# Patient Record
Sex: Female | Born: 1955 | Race: Black or African American | Hispanic: No | Smoking: Never smoker
Health system: Southern US, Community
[De-identification: ages and names within clinical notes are randomized; demographics above are authoritative.]

## PROBLEM LIST (undated history)

## (undated) DIAGNOSIS — I10 Essential (primary) hypertension: Secondary | ICD-10-CM

## (undated) DIAGNOSIS — G4730 Sleep apnea, unspecified: Secondary | ICD-10-CM

## (undated) DIAGNOSIS — I491 Atrial premature depolarization: Secondary | ICD-10-CM

## (undated) HISTORY — PX: CARDIAC CATHETERIZATION: SHX172

## (undated) HISTORY — PX: EYE SURGERY: SHX253

## (undated) HISTORY — PX: COLONOSCOPY: SHX174

---

## 1993-08-08 HISTORY — PX: OOPHORECTOMY: SHX86

## 2006-10-20 ENCOUNTER — Emergency Department: Payer: Self-pay | Admitting: Emergency Medicine

## 2007-03-02 ENCOUNTER — Emergency Department: Payer: Self-pay | Admitting: Emergency Medicine

## 2007-11-07 ENCOUNTER — Emergency Department: Payer: Self-pay | Admitting: Emergency Medicine

## 2007-11-07 ENCOUNTER — Other Ambulatory Visit: Payer: Self-pay

## 2008-01-26 ENCOUNTER — Emergency Department: Payer: Self-pay | Admitting: Emergency Medicine

## 2008-02-29 ENCOUNTER — Other Ambulatory Visit: Payer: Self-pay

## 2008-02-29 ENCOUNTER — Emergency Department: Payer: Self-pay | Admitting: Emergency Medicine

## 2008-04-07 DIAGNOSIS — I10 Essential (primary) hypertension: Secondary | ICD-10-CM | POA: Insufficient documentation

## 2008-04-07 DIAGNOSIS — I491 Atrial premature depolarization: Secondary | ICD-10-CM | POA: Insufficient documentation

## 2008-05-15 ENCOUNTER — Ambulatory Visit: Payer: Self-pay | Admitting: Family Medicine

## 2008-06-24 ENCOUNTER — Emergency Department: Payer: Self-pay | Admitting: Emergency Medicine

## 2008-09-02 ENCOUNTER — Ambulatory Visit: Payer: Self-pay | Admitting: Family Medicine

## 2009-01-15 ENCOUNTER — Emergency Department: Payer: Self-pay | Admitting: Emergency Medicine

## 2009-11-04 ENCOUNTER — Emergency Department: Payer: Self-pay | Admitting: Emergency Medicine

## 2010-02-24 ENCOUNTER — Emergency Department: Payer: Self-pay | Admitting: Emergency Medicine

## 2010-05-27 ENCOUNTER — Emergency Department: Payer: Self-pay | Admitting: Emergency Medicine

## 2010-08-08 DIAGNOSIS — I341 Nonrheumatic mitral (valve) prolapse: Secondary | ICD-10-CM

## 2010-08-08 HISTORY — DX: Nonrheumatic mitral (valve) prolapse: I34.1

## 2012-08-07 ENCOUNTER — Emergency Department: Payer: Self-pay | Admitting: Emergency Medicine

## 2012-08-07 LAB — COMPREHENSIVE METABOLIC PANEL
Albumin: 3.9 g/dL (ref 3.4–5.0)
Alkaline Phosphatase: 105 U/L (ref 50–136)
BUN: 7 mg/dL (ref 7–18)
Bilirubin,Total: <0.1 mg/dL — ABNORMAL LOW (ref 0.2–1.0)
Calcium, Total: 8.9 mg/dL (ref 8.5–10.1)
Creatinine: 0.74 mg/dL (ref 0.60–1.30)
Glucose: 90 mg/dL (ref 65–99)
Osmolality: 284 (ref 275–301)
Potassium: 3.7 mmol/L (ref 3.5–5.1)
SGOT(AST): 28 U/L (ref 15–37)
SGPT (ALT): 21 U/L (ref 12–78)
Sodium: 144 mmol/L (ref 136–145)

## 2012-08-07 LAB — CBC
MCH: 32.5 pg (ref 26.0–34.0)
MCHC: 34.4 g/dL (ref 32.0–36.0)
MCV: 94 fL (ref 80–100)
Platelet: 286 10*3/uL (ref 150–440)
RDW: 13.2 % (ref 11.5–14.5)

## 2013-05-06 ENCOUNTER — Ambulatory Visit: Payer: Self-pay | Admitting: Physician Assistant

## 2013-08-08 HISTORY — PX: CATARACT EXTRACTION W/ INTRAOCULAR LENS IMPLANT: SHX1309

## 2013-10-27 ENCOUNTER — Ambulatory Visit: Payer: Self-pay

## 2013-10-28 ENCOUNTER — Ambulatory Visit: Payer: Self-pay

## 2013-12-10 ENCOUNTER — Ambulatory Visit: Payer: Self-pay | Admitting: Emergency Medicine

## 2014-01-04 ENCOUNTER — Ambulatory Visit: Payer: Self-pay | Admitting: Family Medicine

## 2014-01-13 ENCOUNTER — Emergency Department: Payer: Self-pay | Admitting: Emergency Medicine

## 2014-03-05 ENCOUNTER — Emergency Department: Payer: Self-pay | Admitting: Emergency Medicine

## 2014-03-05 LAB — URINALYSIS, COMPLETE
Bilirubin,UR: NEGATIVE
Blood: NEGATIVE
GLUCOSE, UR: NEGATIVE mg/dL (ref 0–75)
KETONE: NEGATIVE
NITRITE: NEGATIVE
Ph: 7 (ref 4.5–8.0)
Protein: NEGATIVE
SPECIFIC GRAVITY: 1.006 (ref 1.003–1.030)
Squamous Epithelial: 8
WBC UR: 45 /HPF (ref 0–5)

## 2014-04-02 ENCOUNTER — Ambulatory Visit: Payer: Self-pay | Admitting: Internal Medicine

## 2014-05-02 ENCOUNTER — Ambulatory Visit (INDEPENDENT_AMBULATORY_CARE_PROVIDER_SITE_OTHER): Payer: Medicaid Other

## 2014-05-02 ENCOUNTER — Encounter: Payer: Self-pay | Admitting: Podiatry

## 2014-05-02 ENCOUNTER — Ambulatory Visit (INDEPENDENT_AMBULATORY_CARE_PROVIDER_SITE_OTHER): Payer: Medicaid Other | Admitting: Podiatry

## 2014-05-02 VITALS — BP 145/84 | HR 78 | Resp 16 | Ht 63.0 in | Wt 169.0 lb

## 2014-05-02 DIAGNOSIS — M898X9 Other specified disorders of bone, unspecified site: Secondary | ICD-10-CM

## 2014-05-02 DIAGNOSIS — S93609A Unspecified sprain of unspecified foot, initial encounter: Secondary | ICD-10-CM | POA: Diagnosis not present

## 2014-05-02 DIAGNOSIS — M779 Enthesopathy, unspecified: Secondary | ICD-10-CM

## 2014-05-02 MED ORDER — TRIAMCINOLONE ACETONIDE 10 MG/ML IJ SUSP
10.0000 mg | Freq: Once | INTRAMUSCULAR | Status: AC
Start: 2014-05-02 — End: 2014-05-02
  Administered 2014-05-02: 10 mg
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
## 2014-06-27 ENCOUNTER — Encounter: Payer: Self-pay | Admitting: Podiatry

## 2014-06-27 ENCOUNTER — Ambulatory Visit (INDEPENDENT_AMBULATORY_CARE_PROVIDER_SITE_OTHER): Payer: Medicaid Other | Admitting: Podiatry

## 2014-06-27 VITALS — BP 135/80 | HR 70 | Resp 16

## 2014-06-27 DIAGNOSIS — M779 Enthesopathy, unspecified: Secondary | ICD-10-CM

## 2014-06-27 DIAGNOSIS — M7661 Achilles tendinitis, right leg: Secondary | ICD-10-CM | POA: Diagnosis not present

## 2014-06-27 MED ORDER — TRIAMCINOLONE ACETONIDE 10 MG/ML IJ SUSP
10.0000 mg | Freq: Once | INTRAMUSCULAR | Status: AC
Start: 2014-06-27 — End: 2014-06-27
  Administered 2014-06-27: 10 mg

## 2014-06-28 NOTE — Progress Notes (Signed)
Subjective:     Patient ID: Donna Colon, female   DOB: 09-20-1955, 58 y.o.   MRN: [id number]  HPI patient presents stating the ankle that he worked on is pretty good but I'm having pain in my forefoot and I wanted to get that checked   Review of Systems     Objective:   Physical Exam Neurovascular status unchanged with discomfort around the fourth metatarsal phalangeal joint with inflammation noted and no discomfort currently in the ankle region    Assessment:     Inflammatory condition with pain    Plan:     Careful periarticular injection 3 mg dexamethasone

## 2014-10-03 ENCOUNTER — Encounter (INDEPENDENT_AMBULATORY_CARE_PROVIDER_SITE_OTHER): Payer: Medicaid Other | Admitting: Ophthalmology

## 2014-10-03 DIAGNOSIS — H43813 Vitreous degeneration, bilateral: Secondary | ICD-10-CM

## 2014-10-03 DIAGNOSIS — H35033 Hypertensive retinopathy, bilateral: Secondary | ICD-10-CM

## 2014-10-03 DIAGNOSIS — I10 Essential (primary) hypertension: Secondary | ICD-10-CM

## 2014-10-03 DIAGNOSIS — H2512 Age-related nuclear cataract, left eye: Secondary | ICD-10-CM

## 2014-12-16 ENCOUNTER — Telehealth: Payer: Self-pay | Admitting: *Deleted

## 2014-12-16 NOTE — Telephone Encounter (Signed)
Jamie request up-dated PT script faxed to [phone].

## 2014-12-17 NOTE — Telephone Encounter (Signed)
PT facility wanted up to date PT form but I am unsure what the orders would need to be. Evaluate and treat?

## 2014-12-23 ENCOUNTER — Telehealth: Payer: Self-pay | Admitting: *Deleted

## 2014-12-23 NOTE — Telephone Encounter (Signed)
Ok what ever she wants

## 2014-12-23 NOTE — Telephone Encounter (Signed)
Jaime request rx for pt's PT.

## 2015-01-15 ENCOUNTER — Telehealth: Payer: Self-pay | Admitting: *Deleted

## 2015-01-15 NOTE — Telephone Encounter (Signed)
[facility] states she needed to discuss pt's progress.  I called Roselyn Reef she states pt continues to have persistent pain in and around the ankle, and pt consistently misses or cancels appts, including todays.  I told Roselyn Reef I would contact pt and have her come in to be reevaluated by Dr. Paulla Dolly.  Left message [phone] to call and schedule an appt with Dr. Paulla Dolly.

## 2015-01-19 ENCOUNTER — Telehealth: Payer: Self-pay | Admitting: *Deleted

## 2015-01-19 NOTE — Telephone Encounter (Signed)
Donna Colon states she spoke with pt, who stated she would be getting a new referral for PT and would be seeing a different doctor, because Dr. Paulla Dolly was no longer in the [place] office.  Dr. Paulla Dolly states D/C current PT orders.  Informed Jamie.

## 2015-02-05 ENCOUNTER — Ambulatory Visit (INDEPENDENT_AMBULATORY_CARE_PROVIDER_SITE_OTHER): Payer: Self-pay

## 2015-02-05 ENCOUNTER — Ambulatory Visit (INDEPENDENT_AMBULATORY_CARE_PROVIDER_SITE_OTHER): Payer: Self-pay | Admitting: Podiatry

## 2015-02-05 VITALS — BP 138/90 | HR 74 | Resp 16

## 2015-02-05 DIAGNOSIS — M25572 Pain in left ankle and joints of left foot: Secondary | ICD-10-CM

## 2015-02-05 DIAGNOSIS — M779 Enthesopathy, unspecified: Secondary | ICD-10-CM
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
## 2015-02-20 ENCOUNTER — Ambulatory Visit
Admission: EM | Admit: 2015-02-20 | Discharge: 2015-02-20 | Disposition: A | Discharge status: Home / Self Care | Payer: Medicaid Other | Attending: Internal Medicine | Admitting: Internal Medicine

## 2015-02-20 DIAGNOSIS — J019 Acute sinusitis, unspecified: Secondary | ICD-10-CM | POA: Diagnosis not present

## 2015-02-20 HISTORY — DX: Essential (primary) hypertension: I10

## 2015-02-20 MED ORDER — PREDNISONE 50 MG PO TABS: 50.0000 mg | ORAL_TABLET | Freq: Every day | ORAL | Status: DC

## 2015-02-20 MED ORDER — AMOXICILLIN-POT CLAVULANATE 875-125 MG PO TABS: 1.0000 | ORAL_TABLET | Freq: Two times a day (BID) | ORAL | Status: DC

## 2015-02-20 NOTE — ED Provider Notes (Signed)
CSN: [id number]     Arrival date & time 02/20/15  [id number] History   First MD Initiated Contact with Patient 02/20/15 1007     Chief Complaint  Patient presents with  . Sinusitis   HPI  59yo lady with 2 wks hx sinus congestion, post nasal drainage, now prod cough.  Sneezing.  Head/ear pressure.  Low grade temps (99.5).  Malaise.  Nausea, emesis x 1.  Scant mucusy drainage in eyes.    Past Medical History  Diagnosis Date  . Hypertension    History reviewed. No pertinent past surgical history. Family History  Problem Relation Age of Onset  . Heart failure Mother   . Cancer Father    History  Substance Use Topics  . Smoking status: Never Smoker   . Smokeless tobacco: Not on file  . Alcohol Use: No    Review of Systems  All other systems reviewed and are negative.   Allergies  Codeine  Home Medications   Prior to Admission medications   Medication Sig Start Date End Date Taking? Authorizing Provider  aspirin EC 81 MG tablet Take 81 mg by mouth daily.   Yes Historical Provider, MD  amoxicillin-clavulanate (AUGMENTIN) 875-125 MG per tablet Take 1 tablet by mouth every 12 (twelve) hours. 02/20/15   Sherlene Shams, MD  predniSONE (DELTASONE) 50 MG tablet Take 1 tablet (50 mg total) by mouth daily. 02/20/15   Sherlene Shams, MD   BP 146/93 mmHg  Pulse 80  Temp(Src) 97.7 F (36.5 C) (Tympanic)  Resp 16  Ht 5\' 3"  (1.6 m)  Wt 172 lb (78.019 kg)  BMI 30.48 kg/m2  SpO2 100% Physical Exam  Constitutional: She is oriented to person, place, and time. No distress.  Alert, nicely groomed  HENT:  Head: Atraumatic.  B TMs mod dull, no erythema Marked nasal congestion Throat red with post nasal drainage  Eyes:  Conjugate gaze, no eye redness/drainage  Neck: Neck supple.  Cardiovascular: Normal rate and regular rhythm.   Pulmonary/Chest: No respiratory distress. She has no wheezes. She has no rales.  Lungs clear, symmetric breath sounds  Abdominal: She exhibits no distension.   Musculoskeletal: Normal range of motion.  No leg swelling  Neurological: She is alert and oriented to person, place, and time.  Skin: Skin is warm and dry.  No cyanosis  Nursing note and vitals reviewed.   ED Course  Procedures    MDM   1. Subacute sinusitis, unspecified location    rx prednisone, augmentin sent to pharmacy.  Recheck if not improving in several days.    Sherlene Shams, MD 02/20/15 2203

## 2015-02-20 NOTE — ED Notes (Signed)
Hx of sinusitis. Had CT done last year and told had chronic sinusitis. Started 2 weeks ago with sinus congestion. No improvement

## 2015-02-20 NOTE — Discharge Instructions (Signed)
Prescriptions for augmentin (antibiotic) and prednisone (for congestion) were sent to the [facility] in [facility].

## 2015-04-07 ENCOUNTER — Ambulatory Visit: Payer: Medicaid Other | Admitting: Podiatry

## 2015-04-23 ENCOUNTER — Ambulatory Visit: Payer: Self-pay | Admitting: Podiatry

## 2015-05-01 ENCOUNTER — Encounter: Payer: Self-pay | Admitting: Emergency Medicine

## 2015-05-01 ENCOUNTER — Emergency Department
Admission: EM | Admit: 2015-05-01 | Discharge: 2015-05-01 | Discharge status: Against Medical Advice | Payer: Medicaid Other | Attending: Emergency Medicine | Admitting: Emergency Medicine

## 2015-05-01 DIAGNOSIS — I10 Essential (primary) hypertension: Secondary | ICD-10-CM | POA: Insufficient documentation

## 2015-05-01 DIAGNOSIS — R232 Flushing: Secondary | ICD-10-CM | POA: Insufficient documentation

## 2015-05-01 DIAGNOSIS — R079 Chest pain, unspecified: Secondary | ICD-10-CM | POA: Diagnosis present

## 2015-05-01 DIAGNOSIS — M79602 Pain in left arm: Secondary | ICD-10-CM | POA: Insufficient documentation

## 2015-05-01 LAB — CBC WITH DIFFERENTIAL/PLATELET
Basophils Absolute: 0 10*3/uL (ref 0–0.1)
Basophils Relative: 1 %
Eosinophils Absolute: 0.1 10*3/uL (ref 0–0.7)
Eosinophils Relative: 1 %
HEMATOCRIT: 41.5 % (ref 35.0–47.0)
Hemoglobin: 14 g/dL (ref 12.0–16.0)
LYMPHS ABS: 2 10*3/uL (ref 1.0–3.6)
LYMPHS PCT: 30 %
MCH: 31.7 pg (ref 26.0–34.0)
MCHC: 33.7 g/dL (ref 32.0–36.0)
MCV: 94 fL (ref 80.0–100.0)
Monocytes Absolute: 0.4 10*3/uL (ref 0.2–0.9)
Monocytes Relative: 7 %
NEUTROS ABS: 4 10*3/uL (ref 1.4–6.5)
NEUTROS PCT: 61 %
Platelets: 275 10*3/uL (ref 150–440)
RBC: 4.41 MIL/uL (ref 3.80–5.20)
RDW: 13.5 % (ref 11.5–14.5)
WBC: 6.5 10*3/uL (ref 3.6–11.0)

## 2015-05-01 LAB — COMPREHENSIVE METABOLIC PANEL
ALT: 21 U/L (ref 14–54)
AST: 29 U/L (ref 15–41)
Albumin: 4.2 g/dL (ref 3.5–5.0)
Alkaline Phosphatase: 83 U/L (ref 38–126)
Anion gap: 8 (ref 5–15)
BUN: 11 mg/dL (ref 6–20)
CALCIUM: 9.4 mg/dL (ref 8.9–10.3)
CO2: 29 mmol/L (ref 22–32)
Chloride: 105 mmol/L (ref 101–111)
Creatinine, Ser: 0.82 mg/dL (ref 0.44–1.00)
Glucose, Bld: 99 mg/dL (ref 65–99)
Potassium: 3.8 mmol/L (ref 3.5–5.1)
Sodium: 142 mmol/L (ref 135–145)
Total Bilirubin: 0.7 mg/dL (ref 0.3–1.2)
Total Protein: 7.4 g/dL (ref 6.5–8.1)

## 2015-05-01 LAB — TROPONIN I

## 2015-05-01 MED ORDER — TRAZODONE HCL 100 MG PO TABS
ORAL_TABLET | ORAL | Status: AC
  Filled 2015-05-01: qty 1

## 2015-05-01 MED ORDER — FAMOTIDINE 20 MG PO TABS
ORAL_TABLET | ORAL | Status: AC
  Filled 2015-05-01: qty 1

## 2015-05-01 MED ORDER — TRAZODONE HCL 50 MG PO TABS
ORAL_TABLET | ORAL | Status: AC
  Filled 2015-05-01: qty 1

## 2015-05-01 MED ORDER — LITHIUM CARBONATE ER 450 MG PO TBCR
EXTENDED_RELEASE_TABLET | ORAL | Status: AC
  Filled 2015-05-01: qty 1

## 2015-05-01 MED ORDER — GLYCOPYRROLATE 1 MG PO TABS
ORAL_TABLET | ORAL | Status: AC
  Filled 2015-05-01: qty 1

## 2015-05-01 MED ORDER — CLOZAPINE 100 MG PO TABS
ORAL_TABLET | ORAL | Status: AC
  Filled 2015-05-01: qty 3

## 2015-05-01 NOTE — ED Notes (Signed)
Pt back to the registration desk stating that she spoke with her dr and they are able to see her now rather than her waiting to be seen in the ER.

## 2015-05-01 NOTE — ED Notes (Signed)
Reports chest pain and left arm pain since yesterday, also having "hot flashes"
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
## 2015-06-08 ENCOUNTER — Ambulatory Visit: Payer: Self-pay | Admitting: Podiatry

## 2015-06-17 ENCOUNTER — Ambulatory Visit: Payer: Self-pay | Admitting: Podiatry

## 2015-06-19 ENCOUNTER — Ambulatory Visit (INDEPENDENT_AMBULATORY_CARE_PROVIDER_SITE_OTHER): Payer: Medicaid Other

## 2015-06-19 ENCOUNTER — Ambulatory Visit (INDEPENDENT_AMBULATORY_CARE_PROVIDER_SITE_OTHER): Payer: Medicaid Other | Admitting: Podiatry

## 2015-06-19 ENCOUNTER — Encounter: Payer: Self-pay | Admitting: Podiatry

## 2015-06-19 VITALS — BP 158/91 | HR 82 | Resp 16 | Ht 61.0 in | Wt 173.0 lb

## 2015-06-19 DIAGNOSIS — M25572 Pain in left ankle and joints of left foot: Secondary | ICD-10-CM | POA: Diagnosis not present

## 2015-06-19 DIAGNOSIS — M7750 Other enthesopathy of unspecified foot: Secondary | ICD-10-CM

## 2015-06-19 DIAGNOSIS — M779 Enthesopathy, unspecified: Secondary | ICD-10-CM

## 2015-06-19 MED ORDER — TRIAMCINOLONE ACETONIDE 10 MG/ML IJ SUSP
10.0000 mg | Freq: Once | INTRAMUSCULAR | Status: AC
  Administered 2015-06-19: 10 mg
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
## 2015-07-20 ENCOUNTER — Ambulatory Visit: Payer: Medicaid Other | Admitting: Podiatry

## 2015-07-23 ENCOUNTER — Ambulatory Visit (INDEPENDENT_AMBULATORY_CARE_PROVIDER_SITE_OTHER): Payer: Medicaid Other | Admitting: Podiatry

## 2015-07-23 ENCOUNTER — Encounter: Payer: Self-pay | Admitting: Podiatry

## 2015-07-23 VITALS — BP 153/83 | HR 76 | Resp 16

## 2015-07-23 DIAGNOSIS — M7750 Other enthesopathy of unspecified foot: Secondary | ICD-10-CM

## 2015-07-23 DIAGNOSIS — M25572 Pain in left ankle and joints of left foot: Secondary | ICD-10-CM

## 2015-07-23 DIAGNOSIS — M779 Enthesopathy, unspecified: Secondary | ICD-10-CM
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
## 2015-07-24 NOTE — Progress Notes (Signed)
Subjective:     Patient ID: Donna Colon, female   DOB: 1955/10/19, 59 y.o.   MRN: [id number]  HPI patient states I'm doing better but it still doesn't feel completely right   Review of Systems     Objective:   Physical Exam Neurovascular status unchanged with patient having discomfort in the lateral ankle around the peroneal tendon group sinus tarsi with some splinting still noted when I attempted to move the area    Assessment:     Continued sinus tarsitis left with inflammatory changes ankle which are most likely stable and localized in nature    Plan:     Reviewed conditions and x-rays and spent a great of time going over continued immobilization with Tri-Lock ankle brace and advised on continued ice therapy reduced activity and reappoint to recheck

## 2015-08-01 ENCOUNTER — Ambulatory Visit
Admission: EM | Admit: 2015-08-01 | Discharge: 2015-08-01 | Disposition: A | Discharge status: Home / Self Care | Payer: Medicaid Other | Attending: Family Medicine | Admitting: Family Medicine

## 2015-08-01 ENCOUNTER — Encounter: Payer: Self-pay | Admitting: *Deleted

## 2015-08-01 DIAGNOSIS — H6501 Acute serous otitis media, right ear: Secondary | ICD-10-CM

## 2015-08-01 DIAGNOSIS — J0100 Acute maxillary sinusitis, unspecified: Secondary | ICD-10-CM | POA: Diagnosis not present

## 2015-08-01 MED ORDER — AMOXICILLIN 875 MG PO TABS: 875.0000 mg | ORAL_TABLET | Freq: Two times a day (BID) | ORAL | Status: DC

## 2015-08-01 MED ORDER — BENZONATATE 200 MG PO CAPS: 200.0000 mg | ORAL_CAPSULE | Freq: Three times a day (TID) | ORAL | Status: DC | PRN

## 2015-08-01 NOTE — ED Notes (Signed)
Patient started having symptoms of cough, nasal congestion, and fever one week ago. Symptoms have continued to worsen, with no relief after using OTC meds.

## 2015-08-01 NOTE — ED Provider Notes (Signed)
CSN: [id number]     Arrival date & time 08/01/15  [id number] History   First MD Initiated Contact with Patient 08/01/15 0901     Chief Complaint  Patient presents with  . Nasal Congestion  . Fever  . Cough   (Consider location/radiation/quality/duration/timing/severity/associated sxs/prior Treatment) Patient is a 59 y.o. female presenting with URI. The history is provided by the patient.  URI Presenting symptoms: congestion, cough, facial pain, fatigue and fever   Severity:  Moderate Onset quality:  Sudden Duration:  10 days Timing:  Constant Progression:  Worsening Chronicity:  New Relieved by:  Nothing Ineffective treatments:  OTC medications Associated symptoms: headaches, sinus pain and swollen glands   Associated symptoms: no wheezing     Past Medical History  Diagnosis Date  . Hypertension    Past Surgical History  Procedure Laterality Date  . Eye surgery     Family History  Problem Relation Age of Onset  . Heart failure Mother   . Cancer Father    Social History  Substance Use Topics  . Smoking status: Never Smoker   . Smokeless tobacco: Never Used  . Alcohol Use: No   OB History    No data available     Review of Systems  Constitutional: Positive for fever and fatigue.  HENT: Positive for congestion.   Respiratory: Positive for cough. Negative for wheezing.   Neurological: Positive for headaches.    Allergies  Codeine  Home Medications   Prior to Admission medications   Medication Sig Start Date End Date Taking? Authorizing Provider  aspirin EC 81 MG tablet Take 81 mg by mouth daily.   Yes Historical Provider, MD  Multiple Vitamin (MULTIVITAMIN) capsule Take 1 capsule by mouth daily.   Yes Historical Provider, MD  amoxicillin (AMOXIL) 875 MG tablet Take 1 tablet (875 mg total) by mouth 2 (two) times daily. 08/01/15   Norval Gable, MD  benzonatate (TESSALON) 200 MG capsule Take 1 capsule (200 mg total) by mouth 3 (three) times daily as needed for  cough. 08/01/15   Norval Gable, MD   Meds Ordered and Administered this Visit  Medications - No data to display  Pulse 81  Temp(Src) 98.4 F (36.9 C) (Oral)  Resp 18  Ht 5\' 3"  (1.6 m)  Wt 167 lb (75.751 kg)  BMI 29.59 kg/m2  SpO2 98% No data found.   Physical Exam  Constitutional: She appears well-developed and well-nourished. No distress.  HENT:  Head: Normocephalic and atraumatic.  Right Ear: External ear and ear canal normal. Tympanic membrane is erythematous and bulging. A middle ear effusion is present.  Left Ear: Tympanic membrane, external ear and ear canal normal.  Nose: Mucosal edema and rhinorrhea present. No nose lacerations, sinus tenderness, nasal deformity, septal deviation or nasal septal hematoma. No epistaxis.  No foreign bodies. Right sinus exhibits maxillary sinus tenderness and frontal sinus tenderness. Left sinus exhibits maxillary sinus tenderness and frontal sinus tenderness.  Mouth/Throat: Uvula is midline, oropharynx is clear and moist and mucous membranes are normal. No oropharyngeal exudate.  Eyes: Conjunctivae and EOM are normal. Pupils are equal, round, and reactive to light. Right eye exhibits no discharge. Left eye exhibits no discharge. No scleral icterus.  Neck: Normal range of motion. Neck supple. No thyromegaly present.  Cardiovascular: Normal rate, regular rhythm and normal heart sounds.   Pulmonary/Chest: Effort normal and breath sounds normal. No respiratory distress. She has no wheezes. She has no rales.  Lymphadenopathy:    She has no cervical  adenopathy.  Skin: No rash noted. She is not diaphoretic.  Nursing note and vitals reviewed.   ED Course  Procedures (including critical care time)  Labs Review Labs Reviewed - No data to display  Imaging Review No results found.   Visual Acuity Review  Right Eye Distance:   Left Eye Distance:   Bilateral Distance:    Right Eye Near:   Left Eye Near:    Bilateral Near:          MDM   1. Acute maxillary sinusitis, recurrence not specified   2. Right acute serous otitis media, recurrence not specified    New Prescriptions   AMOXICILLIN (AMOXIL) 875 MG TABLET    Take 1 tablet (875 mg total) by mouth 2 (two) times daily.   BENZONATATE (TESSALON) 200 MG CAPSULE    Take 1 capsule (200 mg total) by mouth 3 (three) times daily as needed for cough.   1.  diagnosis reviewed with patient 2. rx as per orders above; reviewed possible side effects, interactions, risks and benefits  3. Recommend supportive treatment with rest, increased fluids, otc analgesics, otc flonase 4. Follow-up prn if symptoms worsen or don't improve    Norval Gable, MD 08/01/15 [phone]

## 2015-10-12 ENCOUNTER — Ambulatory Visit (INDEPENDENT_AMBULATORY_CARE_PROVIDER_SITE_OTHER): Payer: Medicaid Other | Admitting: Podiatry

## 2015-10-12 ENCOUNTER — Encounter: Payer: Self-pay | Admitting: Podiatry

## 2015-10-12 VITALS — BP 146/76 | HR 82 | Resp 16

## 2015-10-12 DIAGNOSIS — M7750 Other enthesopathy of unspecified foot: Secondary | ICD-10-CM | POA: Diagnosis not present

## 2015-10-12 DIAGNOSIS — M779 Enthesopathy, unspecified: Secondary | ICD-10-CM

## 2015-10-12 MED ORDER — TRIAMCINOLONE ACETONIDE 10 MG/ML IJ SUSP
10.0000 mg | Freq: Once | INTRAMUSCULAR | Status: AC
  Administered 2015-10-12: 10 mg

## 2015-10-14 NOTE — Progress Notes (Signed)
Subjective:     Patient ID: Donna Colon, female   DOB: [birth year], 60 y.o.   MRN: [id number]  HPI patient states that she is some improved with her left foot but she continues to experience discomfort in the left ankle if she does prolonged walking   Review of Systems     Objective:   Physical Exam Neurovascular status intact with discomfort in the left sinus tarsi with fluid buildup noted localized in nature    Assessment:     Inflammatory sinus tarsitis left still present but improved    Plan:     Reinjected the sinus tarsi 3 mg Kenalog 5 mg Xylocaine advised on continued brace usage and will be seen back as needed with hopeful resolution of symptoms as they have improved

## 2015-12-27 ENCOUNTER — Ambulatory Visit
Admission: EM | Admit: 2015-12-27 | Discharge: 2015-12-27 | Disposition: A | Discharge status: Home / Self Care | Payer: Medicaid Other | Attending: Family Medicine | Admitting: Family Medicine

## 2015-12-27 ENCOUNTER — Encounter: Payer: Self-pay | Admitting: Gynecology

## 2015-12-27 DIAGNOSIS — Z7982 Long term (current) use of aspirin: Secondary | ICD-10-CM | POA: Diagnosis not present

## 2015-12-27 DIAGNOSIS — K219 Gastro-esophageal reflux disease without esophagitis: Secondary | ICD-10-CM | POA: Diagnosis not present

## 2015-12-27 DIAGNOSIS — W57XXXA Bitten or stung by nonvenomous insect and other nonvenomous arthropods, initial encounter: Secondary | ICD-10-CM | POA: Insufficient documentation

## 2015-12-27 DIAGNOSIS — R300 Dysuria: Secondary | ICD-10-CM | POA: Diagnosis present

## 2015-12-27 DIAGNOSIS — S70362A Insect bite (nonvenomous), left thigh, initial encounter: Secondary | ICD-10-CM | POA: Diagnosis not present

## 2015-12-27 DIAGNOSIS — Z885 Allergy status to narcotic agent status: Secondary | ICD-10-CM | POA: Insufficient documentation

## 2015-12-27 DIAGNOSIS — I10 Essential (primary) hypertension: Secondary | ICD-10-CM | POA: Diagnosis not present

## 2015-12-27 DIAGNOSIS — T148 Other injury of unspecified body region: Secondary | ICD-10-CM

## 2015-12-27 LAB — URINALYSIS COMPLETE WITH MICROSCOPIC (ARMC ONLY)
BACTERIA UA: NONE SEEN
Bilirubin Urine: NEGATIVE
GLUCOSE, UA: NEGATIVE mg/dL
Hgb urine dipstick: NEGATIVE
Ketones, ur: NEGATIVE mg/dL
Leukocytes, UA: NEGATIVE
Nitrite: NEGATIVE
PROTEIN: NEGATIVE mg/dL
RBC / HPF: NONE SEEN RBC/hpf (ref 0–5)
SPECIFIC GRAVITY, URINE: 1.005 (ref 1.005–1.030)
pH: 6.5 (ref 5.0–8.0)

## 2015-12-27 MED ORDER — ONDANSETRON HCL 4 MG PO TABS: 4.0000 mg | ORAL_TABLET | Freq: Four times a day (QID) | ORAL | Status: DC

## 2015-12-27 MED ORDER — ONDANSETRON 8 MG PO TBDP
8.0000 mg | ORAL_TABLET | Freq: Once | ORAL | Status: AC
  Administered 2015-12-27: 8 mg via ORAL

## 2015-12-27 MED ORDER — DOXYCYCLINE HYCLATE 100 MG PO CAPS: 100.0000 mg | ORAL_CAPSULE | Freq: Two times a day (BID) | ORAL | Status: DC

## 2015-12-27 NOTE — ED Notes (Signed)
Pt reports nausea gone/better and given a cup of water to drink. Pt waiting for d/c papers.

## 2015-12-27 NOTE — ED Notes (Addendum)
Patient c/o left upper thigh tick bite x 6 days. Patient also c/o burning with urination.

## 2015-12-27 NOTE — Discharge Instructions (Signed)
Treat tick bite with Doxycycline 100 mg twice daily for 10 days May use nausea medicine once or twice daily if needed Stop Gingerale and drink water Report to Primary Care doctor for follow up   Tick Bite Information Ticks are insects that attach themselves to the skin. There are many types of ticks. Common types include wood ticks and deer ticks. Sometimes, ticks carry diseases that can make a person very ill. The most common places for ticks to attach themselves are the scalp, neck, armpits, waist, and groin.  HOW CAN YOU PREVENT TICK BITES? Take these steps to help prevent tick bites when you are outdoors:  Wear long sleeves and long pants.  Wear white clothes so you can see ticks more easily.  Tuck your pant legs into your socks.  If walking on a trail, stay in the middle of the trail to avoid brushing against bushes.  Avoid walking through areas with long grass.  Put bug spray on all skin that is showing and along boot tops, pant legs, and sleeve cuffs.  Check clothes, hair, and skin often and before going inside.  Brush off any ticks that are not attached.  Take a shower or bath as soon as possible after being outdoors. HOW SHOULD YOU REMOVE A TICK? Ticks should be removed as soon as possible to help prevent diseases. 1. If latex gloves are available, put them on before trying to remove a tick. 2. Use tweezers to grasp the tick as close to the skin as possible. You may also use curved forceps or a tick removal tool. Grasp the tick as close to its head as possible. Avoid grasping the tick on its body. 3. Pull gently upward until the tick lets go. Do not twist the tick or jerk it suddenly. This may break off the tick's head or mouth parts. 4. Do not squeeze or crush the tick's body. This could force disease-carrying fluids from the tick into your body. 5. After the tick is removed, wash the bite area and your hands with soap and water or alcohol. 6. Apply a small amount of  antiseptic cream or ointment to the bite site. 7. Wash any tools that were used. Do not try to remove a tick by applying a hot match, petroleum jelly, or fingernail polish to the tick. These methods do not work. They may also increase the chances of disease being spread from the tick bite. WHEN SHOULD YOU SEEK HELP? Contact your health care provider if you are unable to remove a tick or if a part of the tick breaks off in the skin. After a tick bite, you need to watch for signs and symptoms of diseases that can be spread by ticks. Contact your health care provider if you develop any of the following:  Fever.  Rash.  Redness and puffiness (swelling) in the area of the tick bite.  Tender, puffy lymph glands.  Watery poop (diarrhea).  Weight loss.  Cough.  Feeling more tired than normal (fatigue).  Muscle, joint, or bone pain.  Belly (abdominal) pain.  Headache.  Change in your level of consciousness.  Trouble walking or moving your legs.  Loss of feeling (numbness) in the legs.  Loss of movement (paralysis).  Shortness of breath.  Confusion.  Throwing up (vomiting) many times.   This information is not intended to replace advice given to you by your health care provider. Make sure you discuss any questions you have with your health care provider.  Document Released: 10/19/2009 Document Revised: 03/27/2013 Document Reviewed: 01/02/2013 Elsevier Interactive Patient Education [employer].

## 2015-12-27 NOTE — ED Provider Notes (Signed)
CSN: [id number]     Arrival date & time 12/27/15  1156 History   First MD Initiated Contact with Patient 12/27/15 1320     Chief Complaint  Patient presents with  . Insect Bite  . Urinary Tract Infection   (Consider location/radiation/quality/duration/timing/severity/associated sxs/prior Treatment) HPI  60 yo F presents concerned about a red spot on left thigh where she pulled off a tick 7 days ago.  Friends have made her worry . Area mildly irritated and tender Has had some abdominal pressure and wondered about constipation --so treated with prune juice- had a good bowel movement,.  Has had mild nausea, Denies diarrhea or fever or back pain..  Low abdominal discomfort eased after bowel movement Has known GERD- she treats it with gingerale (5-6 per day)and pizza-- then gets stomach and bowel discomfort- Has had mild abdominal discomfort with voiding . Not sexually active x 12 years. Denies vaginal symptoms Friends have had mild viral illnesses recently  Past Medical History  Diagnosis Date  . Hypertension    Past Surgical History  Procedure Laterality Date  . Eye surgery     Family History  Problem Relation Age of Onset  . Heart failure Mother   . Cancer Father    Social History  Substance Use Topics  . Smoking status: Never Smoker   . Smokeless tobacco: Never Used  . Alcohol Use: No   OB History    No data available     Review of Systems Constitutional: No fever. No headache. Eyes: No visual changes. ENT:No sore throat. Cardiovascular:Negative for chest pain/palpitations Respiratory: Negative for shortness of breath Gastrointestinal: No abdominal pain. Mild nausea, no vomiting, no diarrhea Genitourinary: Negative for dysuria. Normal urination. Some low abdominal discomfort Musculoskeletal: Negative for back pain. FROM extremities without pain Skin: Negative for rash Neurological: Negative for headache, focal weakness or numbness  Allergies  Codeine  Home  Medications   Prior to Admission medications   Medication Sig Start Date End Date Taking? Authorizing Provider  aspirin EC 81 MG tablet Take 81 mg by mouth daily.   Yes Historical Provider, MD  Multiple Vitamin (MULTIVITAMIN) capsule Take 1 capsule by mouth daily.   Yes Historical Provider, MD  benzonatate (TESSALON) 200 MG capsule Take 1 capsule (200 mg total) by mouth 3 (three) times daily as needed for cough. 08/01/15   Norval Gable, MD  doxycycline (VIBRAMYCIN) 100 MG capsule Take 1 capsule (100 mg total) by mouth 2 (two) times daily. 12/27/15   Jan Fireman, PA-C  ondansetron (ZOFRAN) 4 MG tablet Take 1 tablet (4 mg total) by mouth every 6 (six) hours. 12/27/15   Jan Fireman, PA-C   Meds Ordered and Administered this Visit   Medications  ondansetron (ZOFRAN-ODT) disintegrating tablet 8 mg (8 mg Oral Given 12/27/15 1341)    BP 153/75 mmHg  Pulse 77  Temp(Src) 98.2 F (36.8 C) (Oral)  Resp 16  Ht 5\' 3"  (1.6 m)  Wt 160 lb (72.576 kg)  BMI 28.35 kg/m2  SpO2 100% No data found.   Physical Exam  General: NAD, does not appear toxic VSS HEENT:normocephalic,atraumatic, mucous membranes moist,grossly normal hearing Eyes: EOMI, conjunctiva clear, conjugate gaze Neck: supple,no lymphadenopathy Resp : CT A, bilat; normal respiratory effort Card : RRR Abd:  Not distended, soft ,normal bowel sounds. No supra pubic tenderness, no CVAT Skin: no rash, 2 cm halo of erythema surrounding central mark indicated as area where she previously removed tick. Minimal tender, no drainage or abcess MSK: no  deformities, ambulatory without assistance Neuro : good attention,recall-good memory, no focal neuro deficits Psych: speech and behavior appropriate   ED Course  Procedures (including critical care time)  Labs Review Labs Reviewed  URINALYSIS COMPLETEWITH MICROSCOPIC ([place]) - Abnormal; Notable for the following:    Color, Urine STRAW (*)    Squamous Epithelial / LPF 0-5 (*)    All  other components within normal limits  URINE CULTURE    Imaging Review No results found.  Call in 3 days for final urine results- encouraged hydration water.  Avoid gingerale for carbonation/ irritation GERD and bladder Atb coverage for tick bite/cellulitis discussed Rx  relieved nausea   MDM   1. Tick bite   2. Dysuria    Plan: diagnosis reviewed with patient- 3 day callback for UC results Rx as per orders;  benefits, risks, potential side effects reviewed   Recommend supportive treatment with cyclic tylenol  Encourage dietary changes and trial OTC omeprazole for GERD-seek f/u with PCP Seek additional medical care if symptoms worsen or are not improving     Jan Fireman, PA-C 12/28/15 1451

## 2015-12-29 LAB — URINE CULTURE

## 2016-10-14 ENCOUNTER — Ambulatory Visit
Admission: EM | Admit: 2016-10-14 | Discharge: 2016-10-14 | Disposition: A | Discharge status: Home / Self Care | Payer: Medicaid Other | Attending: Family Medicine | Admitting: Family Medicine

## 2016-10-14 DIAGNOSIS — I10 Essential (primary) hypertension: Secondary | ICD-10-CM | POA: Insufficient documentation

## 2016-10-14 DIAGNOSIS — Z7982 Long term (current) use of aspirin: Secondary | ICD-10-CM | POA: Insufficient documentation

## 2016-10-14 DIAGNOSIS — Z8249 Family history of ischemic heart disease and other diseases of the circulatory system: Secondary | ICD-10-CM | POA: Insufficient documentation

## 2016-10-14 LAB — COMPREHENSIVE METABOLIC PANEL
ALT: 17 U/L (ref 14–54)
ANION GAP: 9 (ref 5–15)
AST: 23 U/L (ref 15–41)
Albumin: 4.4 g/dL (ref 3.5–5.0)
Alkaline Phosphatase: 86 U/L (ref 38–126)
BUN: 10 mg/dL (ref 6–20)
CHLORIDE: 103 mmol/L (ref 101–111)
CO2: 29 mmol/L (ref 22–32)
CREATININE: 0.74 mg/dL (ref 0.44–1.00)
Calcium: 9.4 mg/dL (ref 8.9–10.3)
GFR calc non Af Amer: >60 mL/min (ref >60)
Glucose, Bld: 98 mg/dL (ref 65–99)
Potassium: 3.7 mmol/L (ref 3.5–5.1)
SODIUM: 141 mmol/L (ref 135–145)
Total Bilirubin: 0.4 mg/dL (ref 0.3–1.2)
Total Protein: 8.1 g/dL (ref 6.5–8.1)

## 2016-10-14 MED ORDER — LISINOPRIL 20 MG PO TABS: 20.0000 mg | ORAL_TABLET | Freq: Every day | ORAL | 0 refills | Status: DC

## 2016-10-14 NOTE — ED Provider Notes (Signed)
[facility]    CSN: [id number] Arrival date & time: 10/14/16  1155     History   Chief Complaint Chief Complaint  Patient presents with  . Hypertension    HPI Donna Colon is a 61 y.o. female.   61 yo female with a c/o high blood pressure today and dentist's office. Had 3 blood pressure readings and all high (170s-180s/70s). Patient denies any chest pains, shortness of breath, leg swelling, vision changes, numbness/tingling. States has been treated in the past for hypertension, but has not taken any medication in years.    The history is provided by the patient.  Hypertension     Past Medical History:  Diagnosis Date  . Hypertension     There are no active problems to display for this patient.   Past Surgical History:  Procedure Laterality Date  . EYE SURGERY      OB History    No data available       Home Medications    Prior to Admission medications   Medication Sig Start Date End Date Taking? Authorizing Provider  aspirin EC 81 MG tablet Take 81 mg by mouth daily.   Yes Historical Provider, MD  lisinopril (PRINIVIL,ZESTRIL) 20 MG tablet Take 1 tablet (20 mg total) by mouth daily. 10/14/16   Norval Gable, MD    Family History Family History  Problem Relation Age of Onset  . Heart failure Mother   . Cancer Father     Social History Social History  Substance Use Topics  . Smoking status: Never Smoker  . Smokeless tobacco: Never Used  . Alcohol use No     Allergies   Codeine   Review of Systems Review of Systems   Physical Exam Triage Vital Signs ED Triage Vitals  Enc Vitals Group     BP 10/14/16 1227 (!) 166/80     Pulse Rate 10/14/16 1227 78     Resp 10/14/16 1227 18     Temp 10/14/16 1227 98.1 F (36.7 C)     Temp Source 10/14/16 1227 Oral     SpO2 10/14/16 1227 97 %     Weight 10/14/16 1227 162 lb (73.5 kg)     Height 10/14/16 1227 5\' 3"  (1.6 m)     Head Circumference --      Peak Flow --      Pain Score  10/14/16 1230 1     Pain Loc --      Pain Edu? --      Excl. in [place]? --    No data found.   Updated Vital Signs BP (!) 166/80 (BP Location: Left Arm)   Pulse 78   Temp 98.1 F (36.7 C) (Oral)   Resp 18   Ht 5\' 3"  (1.6 m)   Wt 162 lb (73.5 kg)   SpO2 97%   BMI 28.70 kg/m   Visual Acuity Right Eye Distance:   Left Eye Distance:   Bilateral Distance:    Right Eye Near:   Left Eye Near:    Bilateral Near:     Physical Exam  Constitutional: She appears well-developed and well-nourished. No distress.  HENT:  Head: Normocephalic and atraumatic.  Right Ear: External ear normal.  Left Ear: External ear normal.  Nose: No mucosal edema, rhinorrhea, nose lacerations, sinus tenderness, nasal deformity, septal deviation or nasal septal hematoma. No epistaxis.  No foreign bodies. Right sinus exhibits no maxillary sinus tenderness and no frontal sinus tenderness. Left sinus exhibits  no maxillary sinus tenderness and no frontal sinus tenderness.  Mouth/Throat: Uvula is midline, oropharynx is clear and moist and mucous membranes are normal. No oropharyngeal exudate.  Eyes: Conjunctivae and EOM are normal. Pupils are equal, round, and reactive to light. Right eye exhibits no discharge. Left eye exhibits no discharge. No scleral icterus.  Neck: Normal range of motion. Neck supple. No thyromegaly present.  Cardiovascular: Normal rate, regular rhythm and normal heart sounds.   Pulmonary/Chest: Effort normal and breath sounds normal. No respiratory distress. She has no wheezes. She has no rales.  Musculoskeletal: She exhibits no edema.  Lymphadenopathy:    She has no cervical adenopathy.  Skin: She is not diaphoretic.  Nursing note and vitals reviewed.    UC Treatments / Results  Labs (all labs ordered are listed, but only abnormal results are displayed) Labs Reviewed  COMPREHENSIVE METABOLIC PANEL    EKG  EKG Interpretation None       Radiology No results  found.  Procedures Procedures (including critical care time)  Medications Ordered in UC Medications - No data to display   Initial Impression / Assessment and Plan / UC Course  I have reviewed the triage vital signs and the nursing notes.  Pertinent labs & imaging results that were available during my care of the patient were reviewed by me and considered in my medical decision making (see chart for details).       Final Clinical Impressions(s) / UC Diagnoses   Final diagnoses:  Essential hypertension    New Prescriptions Discharge Medication List as of 10/14/2016  2:04 PM    START taking these medications   Details  lisinopril (PRINIVIL,ZESTRIL) 20 MG tablet Take 1 tablet (20 mg total) by mouth daily., Starting Fri 10/14/2016, Normal       1. Lab results and diagnosis reviewed with patient 2. rx as per orders above; reviewed possible side effects, interactions, risks and benefits  3. Recommend establish care with PCP for recheck and further management 4. Follow-up prn if symptoms worsen or don't improve   Norval Gable, MD 10/14/16 1408

## 2016-10-14 NOTE — ED Triage Notes (Addendum)
Pt says she went to the dentist and they checked her BP and it was elevated B/P readings: 187/104, 176/108, 172/103 She says she has been having headaches and pressure in her head.

## 2017-07-31 ENCOUNTER — Encounter: Payer: Self-pay | Admitting: *Deleted

## 2017-07-31 ENCOUNTER — Ambulatory Visit
Admission: EM | Admit: 2017-07-31 | Discharge: 2017-07-31 | Disposition: A | Discharge status: Home / Self Care | Payer: Medicaid Other | Attending: Family Medicine | Admitting: Family Medicine

## 2017-07-31 ENCOUNTER — Other Ambulatory Visit: Payer: Self-pay

## 2017-07-31 DIAGNOSIS — J069 Acute upper respiratory infection, unspecified: Secondary | ICD-10-CM

## 2017-07-31 DIAGNOSIS — M79671 Pain in right foot: Secondary | ICD-10-CM

## 2017-07-31 DIAGNOSIS — R05 Cough: Secondary | ICD-10-CM | POA: Diagnosis not present

## 2017-07-31 MED ORDER — MELOXICAM 15 MG PO TABS: 15.0000 mg | ORAL_TABLET | Freq: Every day | ORAL | 0 refills | Status: DC | PRN

## 2017-07-31 MED ORDER — BENZONATATE 100 MG PO CAPS: 100.0000 mg | ORAL_CAPSULE | Freq: Three times a day (TID) | ORAL | 0 refills | Status: DC | PRN

## 2017-07-31 NOTE — Discharge Instructions (Signed)
Cough medicine as needed.  Mobic as needed for your foot.  If no improvement, see podiatry.  Dr.  Lacinda Axon

## 2017-07-31 NOTE — ED Triage Notes (Signed)
Patient started having symptom of right toe pain approximately 2 weeks ago that has not resolved. She has a family history of GOUT, but she has never had the gout. Symptoms of cough and nasal congestion started 2 days ago.
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
## 2017-08-15 ENCOUNTER — Other Ambulatory Visit: Payer: Self-pay | Admitting: Podiatry

## 2017-08-15 ENCOUNTER — Ambulatory Visit: Payer: Medicaid Other | Admitting: Podiatry

## 2017-08-15 ENCOUNTER — Encounter: Payer: Self-pay | Admitting: Podiatry

## 2017-08-15 ENCOUNTER — Ambulatory Visit (INDEPENDENT_AMBULATORY_CARE_PROVIDER_SITE_OTHER): Payer: Medicaid Other

## 2017-08-15 DIAGNOSIS — M659 Synovitis and tenosynovitis, unspecified: Secondary | ICD-10-CM

## 2017-08-15 DIAGNOSIS — M79671 Pain in right foot: Secondary | ICD-10-CM

## 2017-08-15 MED ORDER — MELOXICAM 15 MG PO TABS: 15.0000 mg | ORAL_TABLET | Freq: Every day | ORAL | 0 refills | Status: DC | PRN
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
## 2017-09-15 ENCOUNTER — Encounter: Payer: Self-pay | Admitting: Podiatry

## 2017-09-15 ENCOUNTER — Ambulatory Visit (INDEPENDENT_AMBULATORY_CARE_PROVIDER_SITE_OTHER): Payer: Medicaid Other

## 2017-09-15 ENCOUNTER — Ambulatory Visit: Payer: Medicaid Other | Admitting: Podiatry

## 2017-09-15 DIAGNOSIS — M7751 Other enthesopathy of right foot: Secondary | ICD-10-CM

## 2017-09-15 DIAGNOSIS — M779 Enthesopathy, unspecified: Secondary | ICD-10-CM

## 2017-09-15 DIAGNOSIS — M659 Synovitis and tenosynovitis, unspecified: Secondary | ICD-10-CM

## 2017-09-15 DIAGNOSIS — M2580 Other specified joint disorders, unspecified joint: Secondary | ICD-10-CM

## 2017-09-18 NOTE — Progress Notes (Signed)
   HPI: 62 year old female presenting today for follow up evaluation of right ankle synovitis. She reports significant improvement in the pain. She states the injection has helped to resolve the symptoms.  She reports a new complaint of pain to the great toe and arch of the right foot that began two weeks ago. She has not done anything to treat these symptoms. Applying pressure increases the pain. Resting the foot helps alleviate it. Patient is here for further evaluation and treatment.   Past Medical History:  Diagnosis Date  . Hypertension      Physical Exam: General: The patient is alert and oriented x3 in no acute distress.  Dermatology: Skin is warm, dry and supple bilateral lower extremities. Negative for open lesions or macerations.  Vascular: Palpable pedal pulses bilaterally. No edema or erythema noted. Capillary refill within normal limits.  Neurological: Epicritic and protective threshold grossly intact bilaterally.   Musculoskeletal Exam: Pain with palpation to the right sesamoid apparatus. Range of motion within normal limits to all pedal and ankle joints bilateral. Muscle strength 5/5 in all groups bilateral.   Radiographic Exam:  Normal osseous mineralization. Joint spaces preserved. No fracture/dislocation/boney destruction.    Assessment: - Right ankle synovitis - resolved - Sesamoiditis right   Plan of Care:  - Patient evaluated. X-Rays reviewed.  - Injection of 0.5 mLs of Celestone Soluspan injected into the sesamoidal apparatus of the right foot.  - Met pads dispensed.  - Continue wearing good shoe gear with ankle braces. - Return to clinic in 4 weeks.    Edrick Kins, DPM [facility]  Dr. Edrick Kins, DPM    [address], [place] [address]                Office [phone]  Fax [phone]

## 2017-09-29 ENCOUNTER — Other Ambulatory Visit: Payer: Self-pay

## 2017-09-29 MED ORDER — MELOXICAM 15 MG PO TABS: 15.0000 mg | ORAL_TABLET | Freq: Every day | ORAL | 2 refills | Status: DC | PRN

## 2017-09-29 NOTE — Telephone Encounter (Signed)
Pharmacy requesting refill for Meloxicam.  Per Dr. Amalia Hailey, ok to refill   Script has been sent to pharmacy

## 2017-10-17 ENCOUNTER — Ambulatory Visit: Payer: Medicaid Other | Admitting: Podiatry

## 2017-11-01 ENCOUNTER — Encounter: Payer: Self-pay | Admitting: Emergency Medicine

## 2017-11-01 ENCOUNTER — Other Ambulatory Visit: Payer: Self-pay

## 2017-11-01 ENCOUNTER — Ambulatory Visit
Admission: EM | Admit: 2017-11-01 | Discharge: 2017-11-01 | Disposition: A | Discharge status: Home / Self Care | Payer: Medicaid Other | Attending: Internal Medicine | Admitting: Internal Medicine

## 2017-11-01 DIAGNOSIS — J019 Acute sinusitis, unspecified: Secondary | ICD-10-CM

## 2017-11-01 DIAGNOSIS — J111 Influenza due to unidentified influenza virus with other respiratory manifestations: Secondary | ICD-10-CM

## 2017-11-01 DIAGNOSIS — R69 Illness, unspecified: Secondary | ICD-10-CM

## 2017-11-01 MED ORDER — FLUTICASONE PROPIONATE 50 MCG/ACT NA SUSP: 2.0000 | Freq: Every day | NASAL | 0 refills | Status: DC

## 2017-11-01 MED ORDER — AMOXICILLIN-POT CLAVULANATE 875-125 MG PO TABS: 1.0000 | ORAL_TABLET | Freq: Two times a day (BID) | ORAL | 0 refills | Status: DC

## 2017-11-01 MED ORDER — BENZONATATE 200 MG PO CAPS: 200.0000 mg | ORAL_CAPSULE | Freq: Three times a day (TID) | ORAL | 1 refills | Status: DC | PRN

## 2017-11-01 NOTE — Discharge Instructions (Addendum)
Prescription for amoxicillin/clavulanate (antibiotic), nasal steroid spray (for congestion) and benzonatate (cough pearl) were sent to the pharmacy.  Anticipate gradual improvement in head congestion, ear ache, cough, over the next several days.  Recheck for new fever >100.5, increasing phlegm production/nasal discharge, or if not starting to improve in a few days.

## 2017-11-01 NOTE — ED Provider Notes (Signed)
[facility]    CSN: [id number] Arrival date & time: 11/01/17  0904     History   Chief Complaint Chief Complaint  Patient presents with  . Nasal Congestion    HPI Donna Colon is a 62 y.o. female.   She presents today with 10-day history of cough, head congestion, right earache.  She had fever to 101.8 initially, achiness, emesis x1.  Headache.  No diarrhea.  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  Here today with a daughter, who had similar illness.   HPI  Past Medical History:  Diagnosis Date  . Hypertension     Past Surgical History:  Procedure Laterality Date  . EYE SURGERY      Home Medications    Prior to Admission medications   Medication Sig Start Date End Date Taking? Authorizing Provider  aspirin EC 81 MG tablet Take 81 mg by mouth daily.   Yes [provider]  Multiple Vitamin (MULTIVITAMIN) capsule Take 1 capsule by mouth daily.   Yes [provider]  amoxicillin-clavulanate (AUGMENTIN) 875-125 MG tablet Take 1 tablet by mouth 2 (two) times daily. 11/01/17   Wynona Luna, MD  benzonatate (TESSALON) 200 MG capsule Take 1 capsule (200 mg total) by mouth 3 (three) times daily as needed for cough. 11/01/17   Wynona Luna, MD  fluticasone [facility]) 50 MCG/ACT nasal spray Place 2 sprays into both nostrils daily. 11/01/17   Wynona Luna, MD  lisinopril (PRINIVIL,ZESTRIL) 20 MG tablet Take 1 tablet (20 mg total) by mouth daily. 10/14/16   Norval Gable, MD  meloxicam (MOBIC) 15 MG tablet Take 1 tablet (15 mg total) by mouth daily as needed for pain. 09/29/17   Edrick Kins, DPM    Family History Family History  Problem Relation Age of Onset  . Heart failure Mother   . Cancer Father     Social History Social History   Tobacco Use  . Smoking status: Never Smoker  . Smokeless tobacco: Never Used  Substance Use Topics  . Alcohol use: No  . Drug use: No     Allergies   Codeine   Review  of Systems Review of Systems  All other systems reviewed and are negative.    Physical Exam Triage Vital Signs ED Triage Vitals  Enc Vitals Group     BP 11/01/17 0928 129/73     Pulse Rate 11/01/17 0928 84     Resp 11/01/17 0928 16     Temp 11/01/17 0928 98.2 F (36.8 C)     Temp Source 11/01/17 0928 Oral     SpO2 11/01/17 0928 98 %     Weight 11/01/17 0929 162 lb (73.5 kg)     Height 11/01/17 0929 5\' 3"  (1.6 m)     Pain Score 11/01/17 0928 8     Pain Loc --    Updated Vital Signs BP 129/73 (BP Location: Left Arm)   Pulse 84   Temp 98.2 F (36.8 C) (Oral)   Resp 16   Ht 5\' 3"  (1.6 m)   Wt 162 lb (73.5 kg)   SpO2 98%   BMI 28.70 kg/m  Physical Exam  Constitutional: She is oriented to person, place, and time. No distress.  Nicely groomed, voice sounds very congested  HENT:  Head: Atraumatic.  Ears are waxy, bilateral TMs are dull, no erythema appreciated Moderately severe nasal congestion bilaterally, nasal septum is excoriated on the right Throat is difficult to  visualize but slightly injected  Eyes:  Conjugate gaze observed, no eye redness/discharge  Neck: Neck supple.  Cardiovascular: Normal rate and regular rhythm.  Pulmonary/Chest: No respiratory distress. She has no wheezes. She has no rales.  Lungs clear, symmetric breath sounds   Abdominal: She exhibits no distension.  Musculoskeletal: Normal range of motion.  Neurological: She is alert and oriented to person, place, and time.  Skin: Skin is warm and dry.  Nursing note and vitals reviewed.    UC Treatments / Results   EKG None Radiology No results found.  Procedures Procedures (including critical care time) None today  Final Clinical Impressions(s) / UC Diagnoses   Final diagnoses:  Acute sinusitis with symptoms > 10 days  Influenza-like illness   Prescription for amoxicillin/clavulanate (antibiotic), nasal steroid spray (for congestion) and benzonatate (cough pearl) were sent to the  pharmacy.  Anticipate gradual improvement in head congestion, ear ache, cough, over the next several days.  Recheck for new fever >100.5, increasing phlegm production/nasal discharge, or if not starting to improve in a few days.   ED Discharge Orders        Ordered    fluticasone (FLONASE) 50 MCG/ACT nasal spray  Daily     11/01/17 0946    amoxicillin-clavulanate (AUGMENTIN) 875-125 MG tablet  2 times daily     11/01/17 0946    benzonatate (TESSALON) 200 MG capsule  3 times daily PRN     11/01/17 0946        Wynona Luna, MD 11/04/17 2103

## 2017-11-01 NOTE — ED Triage Notes (Signed)
Patient in today c/o nasal congestion, right ear pain and fullness and cough x 1 week. Patient had fever (101.8) last week.

## 2018-04-29 ENCOUNTER — Other Ambulatory Visit: Payer: Self-pay

## 2018-04-29 ENCOUNTER — Encounter: Payer: Self-pay | Admitting: Gynecology

## 2018-04-29 ENCOUNTER — Ambulatory Visit
Admission: EM | Admit: 2018-04-29 | Discharge: 2018-04-29 | Disposition: A | Discharge status: Home / Self Care | Payer: Medicaid Other | Attending: Family Medicine | Admitting: Family Medicine

## 2018-04-29 DIAGNOSIS — M7712 Lateral epicondylitis, left elbow: Secondary | ICD-10-CM | POA: Diagnosis not present

## 2018-04-29 MED ORDER — PREDNISONE 20 MG PO TABS: ORAL_TABLET | ORAL | 0 refills | Status: DC

## 2018-04-29 NOTE — ED Triage Notes (Signed)
Per patient with left elbow pain x 2 weeks.

## 2018-04-29 NOTE — ED Provider Notes (Signed)
[facility]    CSN: [id number] Arrival date & time: 04/29/18  0846     History   Chief Complaint No chief complaint on file.   HPI Donna Colon is a 62 y.o. female.   62 yo female with a c/o left elbow pain (lateral) for 2 weeks after doing yard work requiring a lot of gripping. Denies any fall or other injuries. States pain is worse when grabbing, gripping, making a fist.   The history is provided by the patient.    Past Medical History:  Diagnosis Date  . Hypertension     There are no active problems to display for this patient.   Past Surgical History:  Procedure Laterality Date  . EYE SURGERY      OB History   None      Home Medications    Prior to Admission medications   Medication Sig Start Date End Date Taking? Authorizing Provider  aspirin EC 81 MG tablet Take 81 mg by mouth daily.   Yes [provider]  fluticasone (FLONASE) 50 MCG/ACT nasal spray Place 2 sprays into both nostrils daily. 11/01/17  Yes Wynona Luna, MD  lisinopril (PRINIVIL,ZESTRIL) 20 MG tablet Take 1 tablet (20 mg total) by mouth daily. 10/14/16  Yes Norval Gable, MD  Multiple Vitamin (MULTIVITAMIN) capsule Take 1 capsule by mouth daily.   Yes [provider]  amoxicillin-clavulanate (AUGMENTIN) 875-125 MG tablet Take 1 tablet by mouth 2 (two) times daily. 11/01/17   Wynona Luna, MD  benzonatate (TESSALON) 200 MG capsule Take 1 capsule (200 mg total) by mouth 3 (three) times daily as needed for cough. 11/01/17   Wynona Luna, MD  meloxicam (MOBIC) 15 MG tablet Take 1 tablet (15 mg total) by mouth daily as needed for pain. 09/29/17   Edrick Kins, DPM  predniSONE (DELTASONE) 20 MG tablet 2 tabs po qd x 7 days 04/29/18   Norval Gable, MD    Family History Family History  Problem Relation Age of Onset  . Heart failure Mother   . Cancer Father     Social History Social History   Tobacco Use  . Smoking status: Never  Smoker  . Smokeless tobacco: Never Used  Substance Use Topics  . Alcohol use: No  . Drug use: No     Allergies   Codeine   Review of Systems Review of Systems   Physical Exam Triage Vital Signs ED Triage Vitals  Enc Vitals Group     BP 04/29/18 0854 (!) 169/78     Pulse Rate 04/29/18 0854 80     Resp 04/29/18 0854 16     Temp 04/29/18 0854 98.6 F (37 C)     Temp Source 04/29/18 0854 Oral     SpO2 04/29/18 0854 100 %     Weight 04/29/18 0853 162 lb (73.5 kg)     Height 04/29/18 0853 5\' 3"  (1.6 m)     Head Circumference --      Peak Flow --      Pain Score 04/29/18 0852 10     Pain Loc --      Pain Edu? --      Excl. in [place]? --    No data found.  Updated Vital Signs BP (!) 169/78 (BP Location: Right Arm)   Pulse 80   Temp 98.6 F (37 C) (Oral)   Resp 16   Ht 5\' 3"  (1.6 m)   Wt 73.5 kg  SpO2 100%   BMI 28.70 kg/m   Visual Acuity Right Eye Distance:   Left Eye Distance:   Bilateral Distance:    Right Eye Near:   Left Eye Near:    Bilateral Near:     Physical Exam  Constitutional: She appears well-developed and well-nourished. No distress.  Musculoskeletal:       Left elbow: She exhibits normal range of motion, no swelling, no effusion, no deformity and no laceration. Tenderness found. Lateral epicondyle tenderness noted. No radial head, no medial epicondyle and no olecranon process tenderness noted.  Skin: She is not diaphoretic.  Vitals reviewed.    UC Treatments / Results  Labs (all labs ordered are listed, but only abnormal results are displayed) Labs Reviewed - No data to display  EKG None  Radiology No results found.  Procedures Procedures (including critical care time)  Medications Ordered in UC Medications - No data to display  Initial Impression / Assessment and Plan / UC Course  I have reviewed the triage vital signs and the nursing notes.  Pertinent labs & imaging results that were available during my care of the patient  were reviewed by me and considered in my medical decision making (see chart for details).      Final Clinical Impressions(s) / UC Diagnoses   Final diagnoses:  Lateral epicondylitis of left elbow    ED Prescriptions    Medication Sig Dispense Auth. Provider   predniSONE (DELTASONE) 20 MG tablet 2 tabs po qd x 7 days 14 tablet Ocean Schildt, MD      1. diagnosis reviewed with patient 2. rx as per orders above; reviewed possible side effects, interactions, risks and benefits  3. Recommend supportive treatment with rest, ice, otc tylenol prn 4. Follow-up prn if symptoms worsen or don't   improveControlled Substance Prescriptions [place] Controlled Substance Registry consulted? Not Applicable   Norval Gable, MD 04/29/18 1003

## 2018-10-05 ENCOUNTER — Ambulatory Visit
Admission: EM | Admit: 2018-10-05 | Discharge: 2018-10-05 | Disposition: A | Discharge status: Home / Self Care | Payer: Medicaid Other | Attending: Family Medicine | Admitting: Family Medicine

## 2018-10-05 ENCOUNTER — Other Ambulatory Visit: Payer: Self-pay

## 2018-10-05 DIAGNOSIS — I10 Essential (primary) hypertension: Secondary | ICD-10-CM

## 2018-10-05 DIAGNOSIS — H6122 Impacted cerumen, left ear: Secondary | ICD-10-CM | POA: Diagnosis not present

## 2018-10-05 DIAGNOSIS — B9789 Other viral agents as the cause of diseases classified elsewhere: Secondary | ICD-10-CM | POA: Diagnosis not present

## 2018-10-05 DIAGNOSIS — J0100 Acute maxillary sinusitis, unspecified: Secondary | ICD-10-CM | POA: Diagnosis not present

## 2018-10-05 MED ORDER — AMOXICILLIN-POT CLAVULANATE 875-125 MG PO TABS: 1.0000 | ORAL_TABLET | Freq: Two times a day (BID) | ORAL | 0 refills | Status: DC

## 2018-10-05 NOTE — ED Provider Notes (Signed)
[facility] ____________________________________________  Time seen: Approximately 9:35 AM  I have reviewed the triage vital signs and the nursing notes.   HISTORY  Chief Complaint Otalgia (left)   HPI Donna Colon is a 63 y.o. female present with mother at bedside for evaluation of just over 1 week of nasal congestion, nasal drainage and sinus pressure.  Reports over the last week she has had intermittent left ear discomfort and occasional ringing in her ear.  States that she was told at her last primary visit she had a lot of wax in that ear.  Denies hearing changes.  Denies sore throat.  Denies chest pain or shortness of breath.  Does have some chest congestion.  Mild sinus pain currently.  Continues eat and drink well.  No known fevers.  Reports chronic seasonal allergies.  Reports otherwise doing well.  Unresolved with over-the-counter congestion medication.  Denies recent antibiotic use.  [facility]: PCP   Past Medical History:  Diagnosis Date  . Hypertension     There are no active problems to display for this patient.   Past Surgical History:  Procedure Laterality Date  . EYE SURGERY       No current facility-administered medications for this encounter.   Current Outpatient Medications:  .  aspirin EC 81 MG tablet, Take 81 mg by mouth daily., Disp: , Rfl:  .  fluticasone (FLONASE) 50 MCG/ACT nasal spray, Place 2 sprays into both nostrils daily., Disp: 16 g, Rfl: 0 .  lisinopril (PRINIVIL,ZESTRIL) 20 MG tablet, Take 1 tablet (20 mg total) by mouth daily., Disp: 15 tablet, Rfl: 0 .  Multiple Vitamin (MULTIVITAMIN) capsule, Take 1 capsule by mouth daily., Disp: , Rfl:  .  amoxicillin-clavulanate (AUGMENTIN) 875-125 MG tablet, Take 1 tablet by mouth every 12 (twelve) hours., Disp: 20 tablet, Rfl: 0  Allergies Codeine  Family History  Problem Relation Age of Onset  . Heart failure Mother   . Cancer Father     Social  History Social History   Tobacco Use  . Smoking status: Never Smoker  . Smokeless tobacco: Never Used  Substance Use Topics  . Alcohol use: No  . Drug use: No    Review of Systems Constitutional: No fever ENT: No sore throat. As above Cardiovascular: Denies chest pain. Respiratory: Denies shortness of breath. Gastrointestinal: No abdominal pain.  Musculoskeletal: Negative for back pain. Skin: Negative for rash.  ____________________________________________   PHYSICAL EXAM:  VITAL SIGNS: ED Triage Vitals  Enc Vitals Group     BP 10/05/18 0832 (!) 153/94     Pulse Rate 10/05/18 0832 (!) 110     Resp 10/05/18 0832 18     Temp 10/05/18 0832 98.6 F (37 C)     Temp Source 10/05/18 0832 Oral     SpO2 10/05/18 0832 98 %     Weight 10/05/18 0829 167 lb (75.8 kg)     Height 10/05/18 0829 5\' 3"  (1.6 m)     Head Circumference --      Peak Flow --      Pain Score 10/05/18 0829 8     Pain Loc --      Pain Edu? --      Excl. in [place]? --     Constitutional: Alert and oriented. Well appearing and in no acute distress. Eyes: Conjunctivae are normal.  Head: Atraumatic.Mild to moderate tenderness to palpation bilateral frontal and moderate tenderness to palpation bilateral maxillary sinuses. No swelling. No  erythema.   Ears: Left: Nontender, total cerumen impaction, post cerumen removal with curette, canal clear, no erythema, normal TM.  Right: Nontender, normal canal, no erythema, normal TM.  Nose: nasal congestion with bilateral nasal turbinate erythema and edema.   Mouth/Throat: Mucous membranes are moist.  Oropharynx non-erythematous.No tonsillar swelling or exudate.  Neck: No stridor.  No cervical spine tenderness to palpation. Hematological/Lymphatic/Immunilogical: No cervical lymphadenopathy. Cardiovascular: Normal rate, regular rhythm. Grossly normal heart sounds.  Good peripheral circulation. Respiratory: Normal respiratory effort.  No retractions.No wheezes, rales or  rhonchi. Good air movement.  Musculoskeletal: Steady gait. Neurologic:  Normal speech and language. No gait instability. Skin:  Skin is warm, dry and intact. No rash noted. Psychiatric: Mood and affect are normal. Speech and behavior are normal. ___________________________________________   LABS (all labs ordered are listed, but only abnormal results are displayed)  Labs Reviewed - No data to display  PROCEDURES Procedures   Left total cerumen impaction.  RN attempted to irrigate, patient did not tolerate well, cerumen removed with curette, patient tolerated well.  Patient reports ear immediately felt better and ringing fully resolved.  INITIAL IMPRESSION / ASSESSMENT AND PLAN / ED COURSE  Pertinent labs & imaging results that were available during my care of the patient were reviewed by me and considered in my medical decision making (see chart for details).  Well-appearing patient.  No acute distress.  Left cerumen removal, patient tolerated well.  Will treat sinusitis with Augmentin.  Encourage supportive care. Discussed indication, risks and benefits of medications with patient.  Discussed follow up with Primary care physician this week. Discussed follow up and return parameters including no resolution or any worsening concerns. Patient verbalized understanding and agreed to plan.   ____________________________________________   FINAL CLINICAL IMPRESSION(S) / ED DIAGNOSES  Final diagnoses:  Impacted cerumen of left ear  Acute maxillary sinusitis, recurrence not specified     ED Discharge Orders         Ordered    amoxicillin-clavulanate (AUGMENTIN) 875-125 MG tablet  Every 12 hours     10/05/18 0903           Note: This dictation was prepared with Dragon dictation along with smaller phrase technology. Any transcriptional errors that result from this process are unintentional.         Marylene Land, NP 10/05/18 [phone]

## 2018-10-05 NOTE — Discharge Instructions (Addendum)
Take medication as prescribed. Rest. Drink plenty of fluids.  ° °Follow up with your primary care physician this week as needed. Return to Urgent care for new or worsening concerns.  ° °

## 2018-10-05 NOTE — ED Triage Notes (Signed)
Patient complains of left ear pain and ringing sensation. Patient states that she has also noticed some sinus pain and pressure x 1 week.

## 2019-01-11 ENCOUNTER — Encounter: Payer: Self-pay | Admitting: Emergency Medicine

## 2019-01-11 ENCOUNTER — Other Ambulatory Visit: Payer: Self-pay

## 2019-01-11 ENCOUNTER — Ambulatory Visit
Admission: EM | Admit: 2019-01-11 | Discharge: 2019-01-11 | Disposition: A | Discharge status: Home / Self Care | Payer: Medicare Other | Attending: Family Medicine | Admitting: Family Medicine

## 2019-01-11 DIAGNOSIS — D179 Benign lipomatous neoplasm, unspecified: Secondary | ICD-10-CM

## 2019-01-11 DIAGNOSIS — M62838 Other muscle spasm: Secondary | ICD-10-CM | POA: Diagnosis not present

## 2019-01-11 DIAGNOSIS — H9312 Tinnitus, left ear: Secondary | ICD-10-CM

## 2019-01-11 MED ORDER — BACLOFEN 10 MG PO TABS: 10.0000 mg | ORAL_TABLET | Freq: Three times a day (TID) | ORAL | 0 refills | Status: DC | PRN

## 2019-01-11 NOTE — Discharge Instructions (Signed)
If the ringing continues to bother you, see [facility].  Medication as needed for spasm.  Take care  Dr. Lacinda Axon

## 2019-01-11 NOTE — ED Triage Notes (Signed)
Patient c/o left ear fullness and ringing in her ear for about a month.  Patient denies fevers.

## 2019-01-11 NOTE — ED Provider Notes (Signed)
[facility]    CSN: [id number] Arrival date & time: 01/11/19  0804  History   Chief Complaint Chief Complaint  Patient presents with  . Ear Fullness    left   HPI  63 year old female presents with ear fullness and ringing as well as left shoulder pain.  Patient reports a 3-week history of ear fullness.  Associated tenderness.  No pain.  No hearing loss.  She states that she had this in February but improved after irrigation due to cerumen impaction.  She is concerned that she has cerumen impaction again.  Additionally, patient reports a 1.5-week history of pain in the right trapezius region.  Patient has a lump in this region as well.  Appears to be a lipoma.  No fever.  No known relieving factors.  No other associated symptoms.  No other complaints.  PMH, Surgical Hx, Family Hx, Social History reviewed and updated as below.  Past Medical History:  Diagnosis Date  . Hypertension    Past Surgical History:  Procedure Laterality Date  . EYE SURGERY     Home Medications    Prior to Admission medications   Medication Sig Start Date End Date Taking? Authorizing Provider  aspirin EC 81 MG tablet Take 81 mg by mouth daily.   Yes [provider]  fluticasone (FLONASE) 50 MCG/ACT nasal spray Place 2 sprays into both nostrils daily. 11/01/17  Yes Wynona Luna, MD  Multiple Vitamin (MULTIVITAMIN) capsule Take 1 capsule by mouth daily.   Yes [provider]  baclofen (LIORESAL) 10 MG tablet Take 1 tablet (10 mg total) by mouth 3 (three) times daily as needed for muscle spasms. 01/11/19   Coral Spikes, DO    Family History Family History  Problem Relation Age of Onset  . Heart failure Mother   . Cancer Father     Social History Social History   Tobacco Use  . Smoking status: Never Smoker  . Smokeless tobacco: Never Used  Substance Use Topics  . Alcohol use: No  . Drug use: No     Allergies   Codeine   Review of Systems Review of  Systems  HENT:       Ear fullness, tinnitus (left ear).  Musculoskeletal:       Pain - Trapezius (right)   Physical Exam Triage Vital Signs ED Triage Vitals  Enc Vitals Group     BP 01/11/19 0829 (!) 156/77     Pulse Rate 01/11/19 0829 78     Resp 01/11/19 0829 16     Temp 01/11/19 0829 98.3 F (36.8 C)     Temp Source 01/11/19 0829 Oral     SpO2 01/11/19 0829 100 %     Weight 01/11/19 0826 162 lb (73.5 kg)     Height 01/11/19 0826 5\' 3"  (1.6 m)     Head Circumference --      Peak Flow --      Pain Score 01/11/19 0826 0     Pain Loc --      Pain Edu? --      Excl. in [place]? --    Updated Vital Signs BP (!) 156/77 (BP Location: Left Arm)   Pulse 78   Temp 98.3 F (36.8 C) (Oral)   Resp 16   Ht 5\' 3"  (1.6 m)   Wt 73.5 kg   SpO2 100%   BMI 28.70 kg/m   Visual Acuity Right Eye Distance:   Left Eye Distance:  Bilateral Distance:    Right Eye Near:   Left Eye Near:    Bilateral Near:     Physical Exam Vitals signs and nursing note reviewed.  Constitutional:      General: She is not in acute distress.    Appearance: Normal appearance.  HENT:     Head: Normocephalic and atraumatic.     Right Ear: Tympanic membrane normal.     Left Ear: Tympanic membrane normal.     Ears:     Comments: Slight amount of cerumen in each canal. Eyes:     General:        Right eye: No discharge.        Left eye: No discharge.     Conjunctiva/sclera: Conjunctivae normal.  Cardiovascular:     Rate and Rhythm: Normal rate and regular rhythm.  Pulmonary:     Effort: Pulmonary effort is normal.     Breath sounds: Normal breath sounds.  Musculoskeletal:     Comments: Patient with trapezius muscle spasm/tension.  Lipoma noted.  Neurological:     Mental Status: She is alert.  Psychiatric:        Mood and Affect: Mood normal.        Behavior: Behavior normal.    UC Treatments / Results  Labs (all labs ordered are listed, but only abnormal results are displayed) Labs Reviewed -  No data to display  EKG None  Radiology No results found.  Procedures Procedures (including critical care time)  Medications Ordered in UC Medications - No data to display  Initial Impression / Assessment and Plan / UC Course  I have reviewed the triage vital signs and the nursing notes.  Pertinent labs & imaging results that were available during my care of the patient were reviewed by me and considered in my medical decision making (see chart for details).    63 year old female presents with tenderness of the left ear, a lipoma, and trapezius muscle spasm.  Treating with baclofen.  Advised to see ENT if tinnitus persists.  Continue to monitor lipoma.  Final Clinical Impressions(s) / UC Diagnoses   Final diagnoses:  Tinnitus of left ear  Lipoma, unspecified site  Trapezius muscle spasm     Discharge Instructions     If the ringing continues to bother you, see [facility].  Medication as needed for spasm.  Take care  Dr. Lacinda Axon    ED Prescriptions    Medication Sig Dispense Auth. Provider   baclofen (LIORESAL) 10 MG tablet Take 1 tablet (10 mg total) by mouth 3 (three) times daily as needed for muscle spasms. 70 each Coral Spikes, DO     Controlled Substance Prescriptions [place] Controlled Substance Registry consulted? Not Applicable   Coral Spikes, [place] 01/11/19 [phone]

## 2019-11-12 ENCOUNTER — Other Ambulatory Visit: Payer: Self-pay

## 2019-11-12 ENCOUNTER — Ambulatory Visit
Admission: EM | Admit: 2019-11-12 | Discharge: 2019-11-12 | Disposition: A | Discharge status: Home / Self Care | Payer: Medicare Other | Attending: Family Medicine | Admitting: Family Medicine

## 2019-11-12 DIAGNOSIS — K449 Diaphragmatic hernia without obstruction or gangrene: Secondary | ICD-10-CM | POA: Diagnosis not present

## 2019-11-12 DIAGNOSIS — K219 Gastro-esophageal reflux disease without esophagitis: Secondary | ICD-10-CM

## 2019-11-12 MED ORDER — ALUM & MAG HYDROXIDE-SIMETH 200-200-20 MG/5ML PO SUSP
30.0000 mL | Freq: Once | ORAL | Status: AC
  Administered 2019-11-12: 30 mL via ORAL

## 2019-11-12 MED ORDER — LIDOCAINE VISCOUS HCL 2 % MT SOLN
15.0000 mL | Freq: Once | OROMUCOSAL | Status: AC
  Administered 2019-11-12: 12:00:00 15 mL via ORAL

## 2019-11-12 NOTE — ED Triage Notes (Signed)
Pt presents with c/o hiatal hernia and increased acid reflux. Pt states her symptoms have been increasing since eating more varied foods. She also reports burping has increased. Pt has also had to change the type of bra she wears for increased comfort. Pt denies any extreme pain, chest pain or other symptoms.

## 2019-11-12 NOTE — Discharge Instructions (Signed)
Over the counter pepcid or prilosec  Change diet

## 2019-11-14 ENCOUNTER — Other Ambulatory Visit: Payer: Self-pay | Admitting: Internal Medicine

## 2019-11-14 DIAGNOSIS — Z1231 Encounter for screening mammogram for malignant neoplasm of breast: Secondary | ICD-10-CM

## 2019-11-25 NOTE — ED Provider Notes (Signed)
[facility]    CSN: [id number] Arrival date & time: 11/12/19  1120      History   Chief Complaint Chief Complaint  Patient presents with  . Hiatal Hernia    HPI Donna Colon is a 64 y.o. female.   64 yo female with a c/o worsening acid reflux for the past week. States she has a h/o hiatal hernia. States she's been eating more greasy, fatty foods. Denies any vomiting, constipation, diarrhea.      Past Medical History:  Diagnosis Date  . Hypertension     Patient Active Problem List   Diagnosis Date Noted  . Benign essential hypertension 04/07/2008  . 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 EYE SURGERY      OB History   No obstetric history on file.      Home Medications    Prior to Admission medications   Medication Sig Start Date End Date Taking? Authorizing Provider  aspirin EC 81 MG tablet Take 81 mg by mouth daily.   Yes [provider]  fluticasone (FLONASE) 50 MCG/ACT nasal spray Place 2 sprays into both nostrils daily. 11/01/17  Yes Wynona Luna, MD  Multiple Vitamin (MULTIVITAMIN) capsule Take 1 capsule by mouth daily.   Yes [provider]  baclofen (LIORESAL) 10 MG tablet Take 1 tablet (10 mg total) by mouth 3 (three) times daily as needed for muscle spasms. 01/11/19   Coral Spikes, DO  hydrochlorothiazide (HYDRODIURIL) 25 MG tablet Take 25 mg by mouth daily. 06/12/19   [provider]    Family History Family History  Problem Relation Age of Onset  . Heart failure Mother   . Cancer Father     Social History Social History   Tobacco Use  . Smoking status: Never Smoker  . Smokeless tobacco: Never Used  Substance Use Topics  . Alcohol use: No  . Drug use: No     Allergies   Codeine   Review of Systems Review of Systems   Physical Exam Triage Vital Signs ED Triage Vitals  Enc Vitals Group     BP 11/12/19 1141 (!)  169/84     Pulse Rate 11/12/19 1141 74     Resp 11/12/19 1141 18     Temp 11/12/19 1141 97.9 F (36.6 C)     Temp Source 11/12/19 1141 Oral     SpO2 11/12/19 1141 100 %     Weight 11/12/19 1139 164 lb (74.4 kg)     Height 11/12/19 1139 5\' 3"  (1.6 m)     Head Circumference --      Peak Flow --      Pain Score 11/12/19 1138 0     Pain Loc --      Pain Edu? --      Excl. in [place]? --    No data found.  Updated Vital Signs BP (!) 169/84 (BP Location: Left Arm)   Pulse 74   Temp 97.9 F (36.6 C) (Oral)   Resp 18   Ht 5\' 3"  (1.6 m)   Wt 74.4 kg   SpO2 100%   BMI 29.05 kg/m   Visual Acuity Right Eye Distance:   Left Eye Distance:   Bilateral Distance:    Right Eye Near:   Left Eye Near:    Bilateral Near:     Physical Exam Vitals and nursing note reviewed.  Constitutional:  General: She is not in acute distress.    Appearance: She is not toxic-appearing or diaphoretic.  Abdominal:     General: Bowel sounds are normal. There is no distension.     Palpations: Abdomen is soft. There is no mass.     Tenderness: There is no abdominal tenderness. There is no right CVA tenderness, left CVA tenderness, guarding or rebound.     Hernia: No hernia is present.  Neurological:     Mental Status: She is alert.      UC Treatments / Results  Labs (all labs ordered are listed, but only abnormal results are displayed) Labs Reviewed - No data to display  EKG   Radiology No results found.  Procedures Procedures (including critical care time)  Medications Ordered in UC Medications  alum & mag hydroxide-simeth (MAALOX/MYLANTA) 200-200-20 MG/5ML suspension 30 mL (30 mLs Oral Given 11/12/19 1225)    And  lidocaine (XYLOCAINE) 2 % viscous mouth solution 15 mL (15 mLs Oral Given 11/12/19 1227)    Initial Impression / Assessment and Plan / UC Course  I have reviewed the triage vital signs and the nursing notes.  Pertinent labs & imaging results that were available during my  care of the patient were reviewed by me and considered in my medical decision making (see chart for details).      Final Clinical Impressions(s) / UC Diagnoses   Final diagnoses:  Gastroesophageal reflux disease, unspecified whether esophagitis present  Hiatal hernia     Discharge Instructions     Over the counter pepcid or prilosec  Change diet    ED Prescriptions    None      1. diagnosis reviewed with patient; GI cocktail given with improvement of symptoms 2. Recommend supportive treatment with otc GERD medications and diet modifications 3. Follow-up prn if symptoms worsen or don't improve   PDMP not reviewed this encounter.   Norval Gable, MD 11/25/19 [phone]

## 2019-12-04 ENCOUNTER — Encounter: Payer: Self-pay | Admitting: Pulmonary Disease

## 2019-12-04 ENCOUNTER — Other Ambulatory Visit: Payer: Self-pay

## 2019-12-04 ENCOUNTER — Ambulatory Visit (INDEPENDENT_AMBULATORY_CARE_PROVIDER_SITE_OTHER): Payer: Medicare Other | Admitting: Pulmonary Disease

## 2019-12-04 VITALS — BP 142/80 | HR 77 | Temp 97.9°F | Ht 63.0 in | Wt 171.8 lb

## 2019-12-04 DIAGNOSIS — G4733 Obstructive sleep apnea (adult) (pediatric): Secondary | ICD-10-CM

## 2019-12-04 NOTE — Patient Instructions (Signed)
History of moderate obstructive sleep apnea  We will schedule you for a home sleep study Set you up with CPAP once we have results  Follow-up with you in about 2 to 3 months to see how CPAP use is going  Weight loss efforts to keep your weight in check  Call with significant concerns  Sleep Apnea Sleep apnea is a condition in which breathing pauses or becomes shallow during sleep. Episodes of sleep apnea usually last 10 seconds or longer, and they may occur as many as 20 times an hour. Sleep apnea disrupts your sleep and keeps your body from getting the rest that it needs. This condition can increase your risk of certain health problems, including:  Heart attack.  Stroke.  Obesity.  Diabetes.  Heart failure.  Irregular heartbeat. What are the causes? There are three kinds of sleep apnea:  Obstructive sleep apnea. This kind is caused by a blocked or collapsed airway.  Central sleep apnea. This kind happens when the part of the brain that controls breathing does not send the correct signals to the muscles that control breathing.  Mixed sleep apnea. This is a combination of obstructive and central sleep apnea. The most common cause of this condition is a collapsed or blocked airway. An airway can collapse or become blocked if:  Your throat muscles are abnormally relaxed.  Your tongue and tonsils are larger than normal.  You are overweight.  Your airway is smaller than normal. What increases the risk? You are more likely to develop this condition if you:  Are overweight.  Smoke.  Have a smaller than normal airway.  Are elderly.  Are female.  Drink alcohol.  Take sedatives or tranquilizers.  Have a family history of sleep apnea. What are the signs or symptoms? Symptoms of this condition include:  Trouble staying asleep.  Daytime sleepiness and tiredness.  Irritability.  Loud snoring.  Morning headaches.  Trouble  concentrating.  Forgetfulness.  Decreased interest in sex.  Unexplained sleepiness.  Mood swings.  Personality changes.  Feelings of depression.  Waking up often during the night to urinate.  Dry mouth.  Sore throat. How is this diagnosed? This condition may be diagnosed with:  A medical history.  A physical exam.  A series of tests that are done while you are sleeping (sleep study). These tests are usually done in a sleep lab, but they may also be done at home. How is this treated? Treatment for this condition aims to restore normal breathing and to ease symptoms during sleep. It may involve managing health issues that can affect breathing, such as high blood pressure or obesity. Treatment may include:  Sleeping on your side.  Using a decongestant if you have nasal congestion.  Avoiding the use of depressants, including alcohol, sedatives, and narcotics.  Losing weight if you are overweight.  Making changes to your diet.  Quitting smoking.  Using a device to open your airway while you sleep, such as: ? An oral appliance. This is a custom-made mouthpiece that shifts your lower jaw forward. ? A continuous positive airway pressure (CPAP) device. This device blows air through a mask when you breathe out (exhale). ? A nasal expiratory positive airway pressure (EPAP) device. This device has valves that you put into each nostril. ? A bi-level positive airway pressure (BPAP) device. This device blows air through a mask when you breathe in (inhale) and breathe out (exhale).  Having surgery if other treatments do not work. During surgery, excess tissue  is removed to create a wider airway. It is important to get treatment for sleep apnea. Without treatment, this condition can lead to:  High blood pressure.  Coronary artery disease.  In men, an inability to achieve or maintain an erection (impotence).  Reduced thinking abilities. Follow these instructions at  home: Lifestyle  Make any lifestyle changes that your health care provider recommends.  Eat a healthy, well-balanced diet.  Take steps to lose weight if you are overweight.  Avoid using depressants, including alcohol, sedatives, and narcotics.  Do not use any products that contain nicotine or tobacco, such as cigarettes, e-cigarettes, and chewing tobacco. If you need help quitting, ask your health care provider. General instructions  Take over-the-counter and prescription medicines only as told by your health care provider.  If you were given a device to open your airway while you sleep, use it only as told by your health care provider.  If you are having surgery, make sure to tell your health care provider you have sleep apnea. You may need to bring your device with you.  Keep all follow-up visits as told by your health care provider. This is important. Contact a health care provider if:  The device that you received to open your airway during sleep is uncomfortable or does not seem to be working.  Your symptoms do not improve.  Your symptoms get worse. Get help right away if:  You develop: ? Chest pain. ? Shortness of breath. ? Discomfort in your back, arms, or stomach.  You have: ? Trouble speaking. ? Weakness on one side of your body. ? Drooping in your face. These symptoms may represent a serious problem that is an emergency. Do not wait to see if the symptoms will go away. Get medical help right away. Call your local emergency services (911 in the [place] Do not drive yourself to the hospital. Summary  Sleep apnea is a condition in which breathing pauses or becomes shallow during sleep.  The most common cause is a collapsed or blocked airway.  The goal of treatment is to restore normal breathing and to ease symptoms during sleep. This information is not intended to replace advice given to you by your health care provider. Make sure you discuss any questions you  have with your health care provider. Document Revised: 01/09/2019 Document Reviewed: 03/20/2018 Elsevier Patient Education  [place].

## 2019-12-04 NOTE — Progress Notes (Signed)
Donna Colon    PC:[id number]    Oct 26, 1955  Primary Care Physician:[facility]  Referring Physician: [facility] [place],  [place] [id number]  Chief complaint:   History of obstructive sleep apnea  HPI:  Patient was diagnosed with obstructive sleep apnea Moderate obstructive sleep apnea She did try to use CPAP but felt the pressure was too high, a lot of heat She did not get adequate help to resolve the issues and subsequently stopped using CPAP She was advised to return the machine  History of snoring tired all the time Witnessed apneas Usually goes to bed between 7:30 PM to 8:30 PM Falls asleep in about 30 minutes 3-4 awakenings, sometimes for an hour Final wake up time about 6 AM About 20 pound weight gain  Memory is good, focus is good, She does have occasional dryness of her mouth when she wakes up  She has high blood pressure, rhythm problems  Never smoker No alcohol use  No pertinent occupational history  Outpatient Encounter Medications as of 12/04/2019  Medication Sig  . aspirin EC 81 MG tablet Take 81 mg by mouth daily.  . baclofen (LIORESAL) 10 MG tablet Take 1 tablet (10 mg total) by mouth 3 (three) times daily as needed for muscle spasms.  . fluticasone (FLONASE) 50 MCG/ACT nasal spray Place 2 sprays into both nostrils daily.  . hydrochlorothiazide (HYDRODIURIL) 25 MG tablet Take 25 mg by mouth daily.  . Multiple Vitamin (MULTIVITAMIN) capsule Take 1 capsule by mouth daily.   No facility-administered encounter medications on file as of 12/04/2019.    Allergies as of 12/04/2019 - Review Complete 12/04/2019  Allergen Reaction Noted  . Codeine Rash 05/02/2014    Past Medical History:  Diagnosis Date  . Hypertension     Past Surgical History:  Procedure Laterality Date  . EYE SURGERY      Family History  Problem Relation Age of Onset  . Heart failure Mother   . Cancer  Father     Social History   Socioeconomic History  . Marital status: Divorced    Spouse name: Not on file  . Number of children: Not on file  . Years of education: Not on file  . Highest education level: Not on file  Occupational History  . Not on file  Tobacco Use  . Smoking status: Never Smoker  . Smokeless tobacco: Never Used  Substance and Sexual Activity  . Alcohol use: No  . Drug use: No  . Sexual activity: Not on file  Other Topics Concern  . Not on file  Social History Narrative  . Not on file   Social Determinants of Health   Financial Resource Strain:   . Difficulty of Paying Living Expenses:   Food Insecurity:   . Worried About Charity fundraiser in the Last Year:   . Arboriculturist in the Last Year:   Transportation Needs:   . Film/video editor (Medical):   Marland Kitchen Lack of Transportation (Non-Medical):   Physical Activity:   . Days of Exercise per Week:   . Minutes of Exercise per Session:   Stress:   . Feeling of Stress :   Social Connections:   . Frequency of Communication with Friends and Family:   . Frequency of Social Gatherings with Friends and Family:   . Attends Religious Services:   . Active Member of Clubs  or Organizations:   . Attends Archivist Meetings:   Marland Kitchen Marital Status:   Intimate Partner Violence:   . Fear of Current or Ex-Partner:   . Emotionally Abused:   Marland Kitchen Physically Abused:   . Sexually Abused:     Review of Systems  Respiratory: Positive for apnea.   Psychiatric/Behavioral: Positive for sleep disturbance.  All other systems reviewed and are negative.   Vitals:   12/04/19 1048  BP: (!) 142/80  Pulse: 77  Temp: 97.9 F (36.6 C)  SpO2: 96%     Physical Exam  Constitutional: She appears well-developed.  Obese  HENT:  Head: Normocephalic and atraumatic.  Mallampati 3, crowded oropharynx  Eyes: Conjunctivae are normal.  Neck: No tracheal deviation present. No thyromegaly present.  Cardiovascular:  Normal rate and regular rhythm.  Pulmonary/Chest: Effort normal and breath sounds normal. No respiratory distress. She has no wheezes. She has no rales. She exhibits no tenderness.  Musculoskeletal:        General: Normal range of motion.     Cervical back: Normal range of motion and neck supple.  Skin: Skin is warm.  Psychiatric: She has a normal mood and affect.   Results of the Epworth flowsheet 12/04/2019  Sitting and reading 3  Watching TV 3  Sitting, inactive in a public place (e.g. a theatre or a meeting) 0  As a passenger in a car for an hour without a break 2  Lying down to rest in the afternoon when circumstances permit 2  Sitting and talking to someone 3  Sitting quietly after a lunch without alcohol 2  In a car, while stopped for a few minutes in traffic 1  Total score 16    Previous study not available for review Assessment:  History of moderate obstructive sleep apnea  Excessive daytime sleepiness  Obesity  Pathophysiology of sleep disordered breathing discussed Treatment options for sleep disordered breathing discussed  Plan/Recommendations: Schedule the patient for home sleep study  Treatment options will be CPAP-auto titrating CPAP  Encourage weight loss efforts  Encouraged to call with any significant concerns   Sherrilyn Rist MD [facility] 12/04/2019, 11:12 AM  CC: [facility]*

## 2019-12-16 ENCOUNTER — Telehealth: Payer: Self-pay | Admitting: Pulmonary Disease

## 2019-12-16 NOTE — Telephone Encounter (Signed)
atc patient unable to reach left message to call office back x1

## 2019-12-16 NOTE — Telephone Encounter (Signed)
Called and spoke with Donna Colon at [facility]. Procedure code [phone] is a V/B code which does not require PA. Call Ref # [id number].  Medicaid when [facility] is primary no PA is required. Order sent to [facility] to schedule. Rhonda J Cobb

## 2019-12-17 ENCOUNTER — Telehealth: Payer: Self-pay | Admitting: Pulmonary Disease

## 2019-12-17 NOTE — Telephone Encounter (Signed)
Spoke to pt she is aware of the precert and she wants to come to the [place] office to get set up I will notify Suanne Marker in the [place] pulmonary office and she can call pt when she can set her up Joellen Jersey

## 2020-01-10 ENCOUNTER — Ambulatory Visit: Payer: Medicare Other

## 2020-01-10 DIAGNOSIS — G4733 Obstructive sleep apnea (adult) (pediatric): Secondary | ICD-10-CM | POA: Diagnosis not present

## 2020-01-16 ENCOUNTER — Other Ambulatory Visit: Payer: Self-pay | Admitting: Otolaryngology

## 2020-01-16 DIAGNOSIS — H9312 Tinnitus, left ear: Secondary | ICD-10-CM

## 2020-01-16 DIAGNOSIS — G4733 Obstructive sleep apnea (adult) (pediatric): Secondary | ICD-10-CM | POA: Diagnosis not present

## 2020-01-20 ENCOUNTER — Telehealth: Payer: Self-pay | Admitting: Pulmonary Disease

## 2020-01-20 NOTE — Telephone Encounter (Signed)
Attempted to call patient, unable to leave voicemail.

## 2020-01-20 NOTE — Telephone Encounter (Signed)
Call patient  Sleep study result  Date of study: 01/13/2020  Impression: Mild obstructive sleep apnea with significant daytime symptoms  Recommendation: Recommend CPAP therapy for mild obstructive sleep apnea Auto titrating CPAP with pressure settings of 5-15 will be appropriate

## 2020-01-23 ENCOUNTER — Telehealth: Payer: Self-pay | Admitting: Pulmonary Disease

## 2020-01-23 DIAGNOSIS — G4733 Obstructive sleep apnea (adult) (pediatric): Secondary | ICD-10-CM

## 2020-01-23 NOTE — Telephone Encounter (Signed)
Call patient  Sleep study result  Date of study: 01/13/2020  Impression: Mild obstructive sleep apnea Moderate oxygen desaturations  Recommendation: Recommend CPAP therapy for mild obstructive sleep apnea Auto titrating CPAP with pressure settings of 5-15 will be appropriate

## 2020-01-27 NOTE — Telephone Encounter (Signed)
Patient contacted with results of home sleep study. Patient is ready to start CPAP therapy. Order for DME placed, follow up appointment recall verified. Patient verbalized understanding of results and plan of care.

## 2020-01-27 NOTE — Addendum Note (Signed)
Addended by: Tery Sanfilippo R on: 01/27/2020 11:02 AM   Modules accepted: Orders

## 2020-01-29 ENCOUNTER — Ambulatory Visit: Payer: Medicare Other

## 2020-02-14 ENCOUNTER — Ambulatory Visit: Payer: Medicare Other

## 2020-02-28 ENCOUNTER — Ambulatory Visit: Payer: Medicare Other

## 2020-03-09 ENCOUNTER — Telehealth: Payer: Self-pay | Admitting: Pulmonary Disease

## 2020-03-09 DIAGNOSIS — G4733 Obstructive sleep apnea (adult) (pediatric): Secondary | ICD-10-CM

## 2020-03-09 NOTE — Telephone Encounter (Signed)
Spoke with the pt  She is c/o too much pressure from CPAP  She states switched her mask from small to medium and this did not help I printed her DL for review  Eustaquio Maize can you please advise on 8/3/21Ander Slade and CDY are both out of office thanks

## 2020-03-10 NOTE — Telephone Encounter (Signed)
Current pressure 5-15cm h20; AHI 2.1. Please decreased pressure to 5-12cm h20

## 2020-03-10 NOTE — Telephone Encounter (Signed)
Called and spoke with pt letting her know that we were going to decrease her cpap settings and she verbalized understanding. Order has been placed to have pt's settings changed. Nothing further needed.

## 2020-04-22 ENCOUNTER — Ambulatory Visit: Payer: Medicare Other | Admitting: Adult Health

## 2020-04-23 ENCOUNTER — Ambulatory Visit: Payer: Medicare Other | Admitting: Adult Health

## 2020-05-08 ENCOUNTER — Ambulatory Visit (INDEPENDENT_AMBULATORY_CARE_PROVIDER_SITE_OTHER): Payer: Medicare Other | Admitting: Primary Care

## 2020-05-08 ENCOUNTER — Other Ambulatory Visit: Payer: Self-pay

## 2020-05-08 ENCOUNTER — Encounter: Payer: Self-pay | Admitting: Primary Care

## 2020-05-08 DIAGNOSIS — Z9989 Dependence on other enabling machines and devices: Secondary | ICD-10-CM | POA: Diagnosis not present

## 2020-05-08 DIAGNOSIS — G4733 Obstructive sleep apnea (adult) (pediatric): Secondary | ICD-10-CM | POA: Diagnosis not present

## 2020-05-08 NOTE — Patient Instructions (Addendum)
Sleep apnea: Continue to wear CPAP every night for 4-6 hours or more Do not drive if experiencing excessive daytime fatigue or somnolence Use CPAP nasal mask with chin strap  Orders: Sample phillips dreamwisp nasal mask  Follow-up: 1 month televisit with download    CPAP and BPAP Information CPAP and BPAP are methods of helping a person breathe with the use of air pressure. CPAP stands for "continuous positive airway pressure." BPAP stands for "bi-level positive airway pressure." In both methods, air is blown through your nose or mouth and into your air passages to help you breathe well. CPAP and BPAP use different amounts of pressure to blow air. With CPAP, the amount of pressure stays the same while you breathe in and out. With BPAP, the amount of pressure is increased when you breathe in (inhale) so that you can take larger breaths. Your health care provider will recommend whether CPAP or BPAP would be more helpful for you. Why are CPAP and BPAP treatments used? CPAP or BPAP can be helpful if you have:  Sleep apnea.  Chronic obstructive pulmonary disease (COPD).  Heart failure.  Medical conditions that weaken the muscles of the chest including muscular dystrophy, or neurological diseases such as amyotrophic lateral sclerosis (ALS).  Other problems that cause breathing to be weak, abnormal, or difficult. CPAP is most commonly used for obstructive sleep apnea (OSA) to keep the airways from collapsing when the muscles relax during sleep. How is CPAP or BPAP administered? Both CPAP and BPAP are provided by a small machine with a flexible plastic tube that attaches to a plastic mask. You wear the mask. Air is blown through the mask into your nose or mouth. The amount of pressure that is used to blow the air can be adjusted on the machine. Your health care provider will determine the pressure setting that should be used based on your individual needs. When should CPAP or BPAP be used? In  most cases, the mask only needs to be worn during sleep. Generally, the mask needs to be worn throughout the night and during any daytime naps. People with certain medical conditions may also need to wear the mask at other times when they are awake. Follow instructions from your health care provider about when to use the machine. What are some tips for using the mask?   Because the mask needs to be snug, some people feel trapped or closed-in (claustrophobic) when first using the mask. If you feel this way, you may need to get used to the mask. One way to do this is by holding the mask loosely over your nose or mouth and then gradually applying the mask more snugly. You can also gradually increase the amount of time that you use the mask.  Masks are available in various types and sizes. Some fit over your mouth and nose while others fit over just your nose. If your mask does not fit well, talk with your health care provider about getting a different one.  If you are using a mask that fits over your nose and you tend to breathe through your mouth, a chin strap may be applied to help keep your mouth closed.  The CPAP and BPAP machines have alarms that may sound if the mask comes off or develops a leak.  If you have trouble with the mask, it is very important that you talk with your health care provider about finding a way to make the mask easier to tolerate. Do not stop  using the mask. Stopping the use of the mask could have a negative impact on your health. What are some tips for using the machine?  Place your CPAP or BPAP machine on a secure table or stand near an electrical outlet.  Know where the on/off switch is located on the machine.  Follow instructions from your health care provider about how to set the pressure on your machine and when you should use it.  Do not eat or drink while the CPAP or BPAP machine is on. Food or fluids could get pushed into your lungs by the pressure of the CPAP  or BPAP.  Do not smoke. Tobacco smoke residue can damage the machine.  For home use, CPAP and BPAP machines can be rented or purchased through home health care companies. Many different brands of machines are available. Renting a machine before purchasing may help you find out which particular machine works well for you.  Keep the CPAP or BPAP machine and attachments clean. Ask your health care provider for specific instructions. Get help right away if:  You have redness or open areas around your nose or mouth where the mask fits.  You have trouble using the CPAP or BPAP machine.  You cannot tolerate wearing the CPAP or BPAP mask.  You have pain, discomfort, and bloating in your abdomen. Summary  CPAP and BPAP are methods of helping a person breathe with the use of air pressure.  Both CPAP and BPAP are provided by a small machine with a flexible plastic tube that attaches to a plastic mask.  If you have trouble with the mask, it is very important that you talk with your health care provider about finding a way to make the mask easier to tolerate. This information is not intended to replace advice given to you by your health care provider. Make sure you discuss any questions you have with your health care provider. Document Revised: 11/14/2018 Document Reviewed: 06/13/2016 Elsevier Patient Education  [place].

## 2020-05-08 NOTE — Progress Notes (Signed)
@Patient  ID: Donna Colon, female    DOB: May 19, 1956, 64 y.o.   MRN: [id number]  Chief Complaint  Patient presents with  . Follow-up    Pt states she has been doing good with the CPAP. Pt states she is wearing her machine about 4-6 hours a night. DME: [facility]    Referring provider: [facility]  HPI: 64 year old old female, never smoked. PMH significant for OSA. Patient of Dr. Ander Slade. HST 01/13/20 showed mild obstructive sleep apnea.   05/08/2020 Patient presents today for 3 month fu OSA. She received ned CPAP machine 4 days ago. She is having a hard time adjusting to new machine. Reports that she has been snoring with her mouth open and waking up with dry mouth. She wears full face mask, started using chin strap last night and had less air leaks and events. She has tried several masks in the past, she has not tried nasal mask. She feels well when she wears cpap and gets a full nights rest.   Airview download 05/04/20-05/07/20 Usage 3/4 days (75%) Average usage days used 6 hours 20 mins Pressure 5-12cm h20 (11.8- 95%) Airleaks 20L/min  AHI 2.5   Allergies  Allergen Reactions  . Codeine Rash    Immunization History  Administered Date(s) Administered  . Moderna SARS-COVID-2 Vaccination 10/03/2019    Past Medical History:  Diagnosis Date  . Hypertension     Tobacco History: Social History   Tobacco Use  Smoking Status Never Smoker  Smokeless Tobacco Never Used   Counseling given: Not Answered   Outpatient Medications Prior to Visit  Medication Sig Dispense Refill  . aspirin EC 81 MG tablet Take 81 mg by mouth daily.    . baclofen (LIORESAL) 10 MG tablet Take 1 tablet (10 mg total) by mouth 3 (three) times daily as needed for muscle spasms. 30 each 0  . fluticasone (FLONASE) 50 MCG/ACT nasal spray Place 2 sprays into both nostrils daily. 16 g 0  . hydrochlorothiazide (HYDRODIURIL) 25 MG tablet Take 25 mg by mouth daily.    . Multiple Vitamin  (MULTIVITAMIN) capsule Take 1 capsule by mouth daily.     No facility-administered medications prior to visit.      Review of Systems  Review of Systems  Constitutional: Negative.   Respiratory: Negative.   Psychiatric/Behavioral: Negative.     Physical Exam  BP 132/80 (BP Location: Left Arm, Cuff Size: Normal)   Pulse 69   Temp (!) 97.3 F (36.3 C) (Other (Comment)) Comment (Src): wrist  Ht 5\' 3"  (1.6 m)   Wt 169 lb 12.8 oz (77 kg)   SpO2 99%   BMI 30.08 kg/m  Physical Exam Constitutional:      Appearance: Normal appearance.  HENT:     Head: Normocephalic and atraumatic.     Mouth/Throat:     Comments: Deferred d/t masking Cardiovascular:     Rate and Rhythm: Normal rate.  Pulmonary:     Effort: Pulmonary effort is normal.  Neurological:     Mental Status: She is alert and oriented to person, place, and time.  Psychiatric:        Mood and Affect: Mood normal.        Behavior: Behavior normal.        Thought Content: Thought content normal.        Judgment: Judgment normal.      Lab Results:  CBC    Component Value Date/Time   WBC 6.5 05/01/2015  0940   RBC 4.41 05/01/2015 0940   HGB 14.0 05/01/2015 0940   HGB 14.4 08/07/2012 1508   HCT 41.5 05/01/2015 0940   HCT 42.0 08/07/2012 1508   PLT 275 05/01/2015 0940   PLT 286 08/07/2012 1508   MCV 94.0 05/01/2015 0940   MCV 94 08/07/2012 1508   MCH 31.7 05/01/2015 0940   MCHC 33.7 05/01/2015 0940   RDW 13.5 05/01/2015 0940   RDW 13.2 08/07/2012 1508   LYMPHSABS 2.0 05/01/2015 0940   MONOABS 0.4 05/01/2015 0940   EOSABS 0.1 05/01/2015 0940   BASOSABS 0.0 05/01/2015 0940    BMET    Component Value Date/Time   NA 141 10/14/2016 1302   NA 144 08/07/2012 1508   K 3.7 10/14/2016 1302   K 3.7 08/07/2012 1508   CL 103 10/14/2016 1302   CL 109 (H) 08/07/2012 1508   CO2 29 10/14/2016 1302   CO2 28 08/07/2012 1508   GLUCOSE 98 10/14/2016 1302   GLUCOSE 90 08/07/2012 1508   BUN 10 10/14/2016 1302    BUN 7 08/07/2012 1508   CREATININE 0.74 10/14/2016 1302   CREATININE 0.74 08/07/2012 1508   CALCIUM 9.4 10/14/2016 1302   CALCIUM 8.9 08/07/2012 1508   GFRNONAA >60 10/14/2016 1302   GFRNONAA >60 08/07/2012 1508   GFRAA >60 10/14/2016 1302   GFRAA >60 08/07/2012 1508    BNP No results found for: BNP  ProBNP No results found for: PROBNP  Imaging: No results found.   Assessment & Plan:   OSA on CPAP - HST 01/13/20 showed mild OSA - Received CPAP 4 days ago, 75% compliant - Pressure 5-12cm h20; AHI 2.5 - Continue to wear CPAP every night for 4-6 hours or more - Do not drive if experiencing excessive daytime fatigue or somnolence - Recommend patient try sample phillips dreamwisp nasal mask with chin strap - Follow-up: 1 month televisit with download     Martyn Ehrich, NP 05/08/2020

## 2020-05-08 NOTE — Assessment & Plan Note (Signed)
-   HST 01/13/20 showed mild OSA - Received CPAP 4 days ago, 75% compliant - Pressure 5-12cm h20; AHI 2.5 - Continue to wear CPAP every night for 4-6 hours or more - Do not drive if experiencing excessive daytime fatigue or somnolence - Recommend patient try sample phillips dreamwisp nasal mask with chin strap - Follow-up: 1 month televisit with download

## 2020-06-09 ENCOUNTER — Encounter: Payer: Medicare Other | Admitting: Primary Care

## 2020-06-09 ENCOUNTER — Telehealth: Payer: Self-pay | Admitting: Primary Care

## 2020-06-09 NOTE — Telephone Encounter (Signed)
Called to start televisit with patient, left vm that I would contact her again in 5-10 minutes.

## 2020-06-09 NOTE — Telephone Encounter (Signed)
Called a second time to start televisit, left VM to call the office.  Will attempt one more call.  Checking with front office staff to make sure she has not come to the office instead of having a televisit.  Patient not in the lobby.  Contacted patient a 3rd time and left message for patient to call the office to reschedule.

## 2020-06-09 NOTE — Progress Notes (Signed)
 Err

## 2020-06-15 NOTE — Progress Notes (Signed)
Virtual Visit via Video Note  I connected with Donna Colon on 06/15/20 at 11:30 AM EST by a video enabled telemedicine application and verified that I am speaking with the correct person using two identifiers.  Location: Patient: Home Provider: Office   I discussed the limitations of evaluation and management by telemedicine and the availability of in person appointments. The patient expressed understanding and agreed to proceed.  History of Present Illness: 64 year old old female, never smoked. PMH significant for OSA. Patient of Dr. Ander Slade. HST 01/13/20 showed mild obstructive sleep apnea. DME company is [employer].   Previous [facility] pulmonary encounters: 05/08/2020 Patient presents today for 3 month fu OSA. She received ned CPAP machine 4 days ago. She is having a hard time adjusting to new machine. Reports that she has been snoring with her mouth open and waking up with dry mouth. She wears full face mask, started using chin strap last night and had less air leaks and events. She has tried several masks in the past, she has not tried nasal mask. She feels well when she wears cpap and gets a full nights rest.   Airview download 05/04/20-05/07/20 Usage 3/4 days (75%) Average usage days used 6 hours 20 mins Pressure 5-12cm h20 (11.8- 95%) Airleaks 20L/min  AHI 2.5   06/16/2020-interim history Patient contacted today for 1 month follow-up/CPAP compliance check. Continues to struggle a little bit getting used to CPAP mask, she feels the pressure is too high. She has tried adjusting this herself. She is wearing CPAP on average 4-6 hours a night. She tried a sample of the phillips dreamwisp nasal mask but did not like this. She received a new full face mask over the weekend. She was recently treated for a sinus infection with abx and is feeling some better but still has a cough.    Airview download 05/10/20-06/08/20/21 23/30 days (77%); 19 days (63%) > 4 hours Average usage days used 5 hours 36  mins Pressure 4.4-11.6 cmh20 (11.1- 95%) Air leaks 3.2L/min AHI 1.1   Observations/Objective:  - Able to speak in full sentences; no overt shortness of breath, wheezing or cough  Assessment and Plan:  OSA on CPAP - HST 01/13/20 showed mild obstructive sleep apnea. Newly on CPAP. Patient is 73% compliant with use, wearing on average 5 hours 36 mins. She feels CPAP pressure is too high, she has tried adjusting it herself.  - Current pressure 4.4 -11.6 cm h20; Lowering PAP pressure 5-10cm h20 - Advised patient not to adjust pressure herself - Try full face CPAP mask  Sinusitis - Recently treated for sinus infection by PCP - Advised she use saline nasal spray twice daily   Follow Up Instructions:   - 3 month fu with Dr. Ander Slade or sooner if needed  I discussed the assessment and treatment plan with the patient. The patient was provided an opportunity to ask questions and all were answered. The patient agreed with the plan and demonstrated an understanding of the instructions.   The patient was advised to call back or seek an in-person evaluation if the symptoms worsen or if the condition fails to improve as anticipated.  I provided 22 minutes of non-face-to-face time during this encounter.   Martyn Ehrich, NP

## 2020-06-16 ENCOUNTER — Other Ambulatory Visit: Payer: Self-pay

## 2020-06-16 ENCOUNTER — Encounter: Payer: Self-pay | Admitting: Primary Care

## 2020-06-16 ENCOUNTER — Ambulatory Visit (INDEPENDENT_AMBULATORY_CARE_PROVIDER_SITE_OTHER): Payer: Medicare Other | Admitting: Primary Care

## 2020-06-16 DIAGNOSIS — G4733 Obstructive sleep apnea (adult) (pediatric): Secondary | ICD-10-CM | POA: Diagnosis not present

## 2020-06-16 DIAGNOSIS — Z9989 Dependence on other enabling machines and devices: Secondary | ICD-10-CM | POA: Diagnosis not present

## 2020-06-16 NOTE — Patient Instructions (Addendum)
Sleep apnea: - We are lowering your CPAP pressure slightly, please do not change your self- notify us if having issues with pressure setting  - Continue to wear CPAP every night for minimum of 4-6 hours or more each night - Do not drive if experiencing excessive daytime fatigue or somnolence - Do not take sedating medication or drink alcohol in excess prior to bedtime as these can worsen underlying sleep apnea  Sinusitis: - Add saline nasal spray twice daily (morning and bedtime)  Orders: - Change pressure to 5-10cm h20   Follow-up: - 3 months with Dr. Ander Slade or sooner if needed    CPAP and BPAP Information CPAP and BPAP are methods of helping a person breathe with the use of air pressure. CPAP stands for "continuous positive airway pressure." BPAP stands for "bi-level positive airway pressure." In both methods, air is blown through your nose or mouth and into your air passages to help you breathe well. CPAP and BPAP use different amounts of pressure to blow air. With CPAP, the amount of pressure stays the same while you breathe in and out. With BPAP, the amount of pressure is increased when you breathe in (inhale) so that you can take larger breaths. Your health care provider will recommend whether CPAP or BPAP would be more helpful for you. Why are CPAP and BPAP treatments used? CPAP or BPAP can be helpful if you have:  Sleep apnea.  Chronic obstructive pulmonary disease (COPD).  Heart failure.  Medical conditions that weaken the muscles of the chest including muscular dystrophy, or neurological diseases such as amyotrophic lateral sclerosis (ALS).  Other problems that cause breathing to be weak, abnormal, or difficult. CPAP is most commonly used for obstructive sleep apnea (OSA) to keep the airways from collapsing when the muscles relax during sleep. How is CPAP or BPAP administered? Both CPAP and BPAP are provided by a small machine with a flexible plastic tube that attaches to  a plastic mask. You wear the mask. Air is blown through the mask into your nose or mouth. The amount of pressure that is used to blow the air can be adjusted on the machine. Your health care provider will determine the pressure setting that should be used based on your individual needs. When should CPAP or BPAP be used? In most cases, the mask only needs to be worn during sleep. Generally, the mask needs to be worn throughout the night and during any daytime naps. People with certain medical conditions may also need to wear the mask at other times when they are awake. Follow instructions from your health care provider about when to use the machine. What are some tips for using the mask?   Because the mask needs to be snug, some people feel trapped or closed-in (claustrophobic) when first using the mask. If you feel this way, you may need to get used to the mask. One way to do this is by holding the mask loosely over your nose or mouth and then gradually applying the mask more snugly. You can also gradually increase the amount of time that you use the mask.  Masks are available in various types and sizes. Some fit over your mouth and nose while others fit over just your nose. If your mask does not fit well, talk with your health care provider about getting a different one.  If you are using a mask that fits over your nose and you tend to breathe through your mouth, a chin strap may  be applied to help keep your mouth closed.  The CPAP and BPAP machines have alarms that may sound if the mask comes off or develops a leak.  If you have trouble with the mask, it is very important that you talk with your health care provider about finding a way to make the mask easier to tolerate. Do not stop using the mask. Stopping the use of the mask could have a negative impact on your health. What are some tips for using the machine?  Place your CPAP or BPAP machine on a secure table or stand near an electrical  outlet.  Know where the on/off switch is located on the machine.  Follow instructions from your health care provider about how to set the pressure on your machine and when you should use it.  Do not eat or drink while the CPAP or BPAP machine is on. Food or fluids could get pushed into your lungs by the pressure of the CPAP or BPAP.  Do not smoke. Tobacco smoke residue can damage the machine.  For home use, CPAP and BPAP machines can be rented or purchased through home health care companies. Many different brands of machines are available. Renting a machine before purchasing may help you find out which particular machine works well for you.  Keep the CPAP or BPAP machine and attachments clean. Ask your health care provider for specific instructions. Get help right away if:  You have redness or open areas around your nose or mouth where the mask fits.  You have trouble using the CPAP or BPAP machine.  You cannot tolerate wearing the CPAP or BPAP mask.  You have pain, discomfort, and bloating in your abdomen. Summary  CPAP and BPAP are methods of helping a person breathe with the use of air pressure.  Both CPAP and BPAP are provided by a small machine with a flexible plastic tube that attaches to a plastic mask.  If you have trouble with the mask, it is very important that you talk with your health care provider about finding a way to make the mask easier to tolerate. This information is not intended to replace advice given to you by your health care provider. Make sure you discuss any questions you have with your health care provider. Document Revised: 11/14/2018 Document Reviewed: 06/13/2016 Elsevier Patient Education  [place].

## 2020-07-15 ENCOUNTER — Telehealth: Payer: Self-pay | Admitting: Primary Care

## 2020-07-15 NOTE — Telephone Encounter (Signed)
Called and spoke with Ivin Booty and confirmed the info she was needing faxed on pt. Pt's OV notes and sleep study have been faxed to provided fax number. Nothing further needed.

## 2020-08-04 ENCOUNTER — Ambulatory Visit: Payer: Medicare Other

## 2020-08-11 ENCOUNTER — Ambulatory Visit: Payer: Medicare Other

## 2020-10-26 ENCOUNTER — Ambulatory Visit: Payer: Medicare Other | Admitting: Pulmonary Disease

## 2020-11-16 ENCOUNTER — Ambulatory Visit: Payer: Medicaid Other | Admitting: Pulmonary Disease

## 2020-12-11 ENCOUNTER — Telehealth: Payer: Self-pay | Admitting: Pulmonary Disease

## 2020-12-11 NOTE — Telephone Encounter (Signed)
Called and spoke with pt and she stated that she is requesting that her visit for Monday be changed to a televisit due to her mom just had knee surgery and the lady that they had that was staying with her mom quit.  She does not have anyone to stay with her mom.  AO please advise if ok to change to a televisit.   Thanks

## 2020-12-14 ENCOUNTER — Ambulatory Visit: Payer: Medicaid Other | Admitting: Pulmonary Disease

## 2020-12-15 NOTE — Telephone Encounter (Signed)
Called pt but was not able to reach her.  Left pt a detailed message making her aware that we have sent request to Dr. Jenetta Downer asking if appt can be changed to a televisit and stated to her in the message that we would call her back once we heard from him.  Dr. Jenetta Downer, please advise.

## 2020-12-15 NOTE — Telephone Encounter (Signed)
I have called and LM on VM to make the pt aware that the visit with AO has been changed to a televisit

## 2020-12-15 NOTE — Telephone Encounter (Signed)
Okay to change to televisit.  

## 2020-12-29 ENCOUNTER — Other Ambulatory Visit: Payer: Self-pay

## 2020-12-29 ENCOUNTER — Ambulatory Visit (INDEPENDENT_AMBULATORY_CARE_PROVIDER_SITE_OTHER): Payer: 59 | Admitting: Pulmonary Disease

## 2020-12-29 DIAGNOSIS — Z9989 Dependence on other enabling machines and devices: Secondary | ICD-10-CM

## 2020-12-29 DIAGNOSIS — G4733 Obstructive sleep apnea (adult) (pediatric): Secondary | ICD-10-CM

## 2020-12-29 NOTE — Addendum Note (Signed)
Addended byCoralie Keens on: 12/29/2020 11:25 AM   Modules accepted: Orders

## 2020-12-29 NOTE — Progress Notes (Signed)
Virtual Visit via Telephone Note  I connected with [name] on 12/29/20 at 10:15 AM EDT by telephone and verified that I am speaking with the correct person using two identifiers.  Location: Patient: Donna Colon Provider: Adrian Prince discussed the limitations, risks, security and privacy concerns of performing an evaluation and management service by telephone and the availability of in person appointments. I also discussed with the patient that there may be a patient responsible charge related to this service. The patient expressed understanding and agreed to proceed.   History of Present Illness: Patient with obstructive sleep apnea Has not symptoms consistent with URI Nasal stuffiness and congestion, cough  No chest pains or chest discomfort  Has not been having any issues with her CPAP Tries to use her CPAP regularly   Observations/Objective: Download from her machine reveals 70% compliance Average use of 6 hours 50 minutes on nights used Machine set between 5 and 10 95 percentile pressure of 9.5 Residual AHI of 1  Assessment and Plan: Mild obstructive sleep apnea on CPAP therapy Benefiting from CPAP use  Encouraged to continue using CPAP on a nightly basis Encouraged to call with any significant concerns  Follow Up Instructions:  Follow-up in 6 months Encouraged to call with any significant concerns DME referral for CPAP supplies    I discussed the assessment and treatment plan with the patient. The patient was provided an opportunity to ask questions and all were answered. The patient agreed with the plan and demonstrated an understanding of the instructions.   The patient was advised to call back or seek an in-person evaluation if the symptoms worsen or if the condition fails to improve as anticipated.  I provided 15 minutes of non-face-to-face time during this encounter.   Laurin Coder, MD

## 2020-12-29 NOTE — Patient Instructions (Signed)
DME referral for CPAP supplies   Follow-up in 6 months

## 2021-01-08 ENCOUNTER — Other Ambulatory Visit: Payer: Self-pay

## 2021-01-08 ENCOUNTER — Ambulatory Visit
Admission: EM | Admit: 2021-01-08 | Discharge: 2021-01-08 | Disposition: A | Discharge status: Home / Self Care | Payer: 59 | Attending: Emergency Medicine | Admitting: Emergency Medicine

## 2021-01-08 DIAGNOSIS — J069 Acute upper respiratory infection, unspecified: Secondary | ICD-10-CM | POA: Diagnosis not present

## 2021-01-08 MED ORDER — PROMETHAZINE-DM 6.25-15 MG/5ML PO SYRP: 5.0000 mL | ORAL_SOLUTION | Freq: Four times a day (QID) | ORAL | 0 refills | Status: DC | PRN

## 2021-01-08 MED ORDER — BENZONATATE 100 MG PO CAPS: 200.0000 mg | ORAL_CAPSULE | Freq: Three times a day (TID) | ORAL | 0 refills | Status: DC

## 2021-01-08 MED ORDER — IPRATROPIUM BROMIDE 0.06 % NA SOLN: 2.0000 | Freq: Four times a day (QID) | NASAL | 12 refills | Status: DC

## 2021-01-08 NOTE — ED Provider Notes (Signed)
[facility]    CSN: [id number] Arrival date & time: 01/08/21  0844      History   Chief Complaint Chief Complaint  Patient presents with  . Cough    HPI Donna Colon is a 65 y.o. female.   HPI   65 year old female here for evaluation of cough.  Patient reports that she has had a cough for the last 3 to 4 days.  She was diagnosed with COVID on 12/21/2020 and feels like this is just a continuation of her symptoms.  She has had an intermittently productive cough with green sputum, runny nose, nasal congestion with clear to green nasal discharge.  She denies fever shortness breath or wheezing, ear pain, or sore throat.  Past Medical History:  Diagnosis Date  . Hypertension     Patient Active Problem List   Diagnosis Date Noted  . OSA on CPAP 05/08/2020  . Benign essential hypertension 04/07/2008  . 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 EYE SURGERY      OB History   No obstetric history on file.      Home Medications    Prior to Admission medications   Medication Sig Start Date End Date Taking? Authorizing Provider  aspirin EC 81 MG tablet Take 81 mg by mouth daily.   Yes [provider]  benzonatate (TESSALON) 100 MG capsule Take 2 capsules (200 mg total) by mouth every 8 (eight) hours. 01/08/21  Yes Margarette Canada, NP  fluticasone (FLONASE) 50 MCG/ACT nasal spray Place 2 sprays into both nostrils daily. 11/01/17  Yes Wynona Luna, MD  hydrochlorothiazide (HYDRODIURIL) 25 MG tablet Take 25 mg by mouth daily. 06/12/19  Yes [provider]  ipratropium (ATROVENT) 0.06 % nasal spray Place 2 sprays into both nostrils 4 (four) times daily. 01/08/21  Yes Margarette Canada, NP  Multiple Vitamin (MULTIVITAMIN) capsule Take 1 capsule by mouth daily.   Yes [provider]  promethazine-dextromethorphan (PROMETHAZINE-DM) 6.25-15 MG/5ML syrup Take 5 mLs by mouth 4 (four) times  daily as needed. 01/08/21  Yes Margarette Canada, NP    Family History Family History  Problem Relation Age of Onset  . Heart failure Mother   . Cancer Father     Social History Social History   Tobacco Use  . Smoking status: Never Smoker  . Smokeless tobacco: Never Used  Vaping Use  . Vaping Use: Never used  Substance Use Topics  . Alcohol use: No  . Drug use: No     Allergies   Codeine   Review of Systems Review of Systems  Constitutional: Negative for activity change, appetite change and fever.  HENT: Positive for congestion and rhinorrhea. Negative for ear pain and sore throat.   Respiratory: Positive for cough. Negative for shortness of breath and wheezing.   Skin: Negative for rash.  Hematological: Negative.   Psychiatric/Behavioral: Negative.      Physical Exam Triage Vital Signs ED Triage Vitals  Enc Vitals Group     BP 01/08/21 0917 (!) 163/74     Pulse Rate 01/08/21 0917 71     Resp 01/08/21 0917 18     Temp 01/08/21 0917 98.6 F (37 C)     Temp Source 01/08/21 0917 Oral     SpO2 01/08/21 0917 100 %     Weight 01/08/21 0918 167 lb (75.8 kg)     Height 01/08/21 0918 5\' 3"  (1.6 m)  Head Circumference --      Peak Flow --      Pain Score 01/08/21 0917 8     Pain Loc --      Pain Edu? --      Excl. in [place]? --    No data found.  Updated Vital Signs BP (!) 163/74 (BP Location: Left Arm)   Pulse 71   Temp 98.6 F (37 C) (Oral)   Resp 18   Ht 5\' 3"  (1.6 m)   Wt 167 lb (75.8 kg)   SpO2 100%   BMI 29.58 kg/m   Visual Acuity Right Eye Distance:   Left Eye Distance:   Bilateral Distance:    Right Eye Near:   Left Eye Near:    Bilateral Near:     Physical Exam Vitals and nursing note reviewed.  Constitutional:      General: She is not in acute distress.    Appearance: Normal appearance. She is normal weight. She is not ill-appearing.  HENT:     Head: Normocephalic and atraumatic.     Right Ear: Tympanic membrane, ear canal and external  ear normal. There is no impacted cerumen.     Left Ear: Tympanic membrane, ear canal and external ear normal. There is no impacted cerumen.     Nose: Congestion and rhinorrhea present.     Mouth/Throat:     Mouth: Mucous membranes are moist.     Pharynx: Oropharynx is clear. Posterior oropharyngeal erythema present.  Cardiovascular:     Rate and Rhythm: Normal rate and regular rhythm.     Pulses: Normal pulses.     Heart sounds: Normal heart sounds. No murmur heard. No gallop.   Pulmonary:     Effort: Pulmonary effort is normal.     Breath sounds: Normal breath sounds. No wheezing, rhonchi or rales.  Musculoskeletal:     Cervical back: Normal range of motion and neck supple.  Lymphadenopathy:     Cervical: No cervical adenopathy.  Skin:    General: Skin is warm and dry.     Capillary Refill: Capillary refill takes less than 2 seconds.     Findings: No rash.  Neurological:     General: No focal deficit present.     Mental Status: She is alert and oriented to person, place, and time.  Psychiatric:        Mood and Affect: Mood normal.        Behavior: Behavior normal.        Thought Content: Thought content normal.        Judgment: Judgment normal.      UC Treatments / Results  Labs (all labs ordered are listed, but only abnormal results are displayed) Labs Reviewed - No data to display  EKG   Radiology No results found.  Procedures Procedures (including critical care time)  Medications Ordered in UC Medications - No data to display  Initial Impression / Assessment and Plan / UC Course  I have reviewed the triage vital signs and the nursing notes.  Pertinent labs & imaging results that were available during my care of the patient were reviewed by me and considered in my medical decision making (see chart for details).   Patient is a very pleasant, nontoxic-appearing 65 year old female here for evaluation of cough.  She has had a cough for last 3 to 4 days but has  had some continued nasal congestion, runny nose, and nasal discharge since being diagnosed with COVID on 12/21/2020.  She has been afebrile and denies shortness of breath or wheezing.  Physical exam reveals pearly gray tympanic membranes bilaterally with a normal light reflex and clear external auditory canals.  Nasal mucosa is erythematous and edematous with scant clear nasal discharge bilaterally.  Oropharyngeal exam reveals posterior oropharyngeal erythema with mild cobblestoning and clear postnasal drip.  No cervical lymphadenopathy appreciated exam.  Cardiopulmonary exam is benign.  Will discharge patient home with a diagnosis of URI with a cough and treat with ipratropium nasal spray, Tessalon Perles, and Promethazine DM cough syrup.   Final Clinical Impressions(s) / UC Diagnoses   Final diagnoses:  Viral URI with cough     Discharge Instructions     Use the Atrovent nasal spray, 2 squirts in each nostril every 6 hours, as needed for runny nose and postnasal drip.  Use the Tessalon Perles every 8 hours during the day.  Take them with a small sip of water.  They may give you some numbness to the base of your tongue or a metallic taste in your mouth, this is normal.  Use the Promethazine DM cough syrup at bedtime for cough and congestion.  It will make you drowsy so do not take it during the day.  Return for reevaluation or see your primary care provider for any new or worsening symptoms.     ED Prescriptions    Medication Sig Dispense Auth. Provider   ipratropium (ATROVENT) 0.06 % nasal spray Place 2 sprays into both nostrils 4 (four) times daily. 15 mL Margarette Canada, NP   benzonatate (TESSALON) 100 MG capsule Take 2 capsules (200 mg total) by mouth every 8 (eight) hours. 21 capsule Margarette Canada, NP   promethazine-dextromethorphan (PROMETHAZINE-DM) 6.25-15 MG/5ML syrup Take 5 mLs by mouth 4 (four) times daily as needed. 118 mL Margarette Canada, NP     PDMP not reviewed this encounter.    Margarette Canada, NP 01/08/21 [phone]

## 2021-01-08 NOTE — Discharge Instructions (Addendum)

## 2021-01-08 NOTE — ED Triage Notes (Signed)
Patient states that she has been having a cough x 3-4 weeks. States that she had covid on the 16th and feels like she has not fully healed. States that she is coughing up thick gray/green mucus.

## 2021-04-15 ENCOUNTER — Telehealth: Payer: Self-pay | Admitting: Pulmonary Disease

## 2021-04-15 DIAGNOSIS — G4733 Obstructive sleep apnea (adult) (pediatric): Secondary | ICD-10-CM

## 2021-04-16 NOTE — Telephone Encounter (Signed)
Call made to patient, confirmed DOB. Patient states she currently is using a small mask for cpap machine. She states it is too tight and leaves indention's on her face. She has adjusted the straps and the mask is still too tight. She states she uses the full face mask and would like to try a medium or large. I made her aware per our notes (snapshot) she was using a medium mask. She states the readings on the mask state small. I made her aware I would send a message to her provider and get back with her.   OA please advise, can we send order for medium size full face mask or send for mask fitting. Thanks :)

## 2021-04-20 NOTE — Telephone Encounter (Signed)
Mask fitting and CPAP supplies with DME company will be most appropriate

## 2021-04-20 NOTE — Telephone Encounter (Signed)
Order placed for supplies and new masks  Pt aware

## 2021-06-03 ENCOUNTER — Telehealth: Payer: Self-pay | Admitting: Pulmonary Disease

## 2021-06-03 NOTE — Telephone Encounter (Signed)
I spoke with Dr. Jenetta Downer and he is ok with a televist this time. He reports that he does not prefer the telephone visit.   Patient is aware that she can do this by telephone this visit and nothing further is needed.

## 2021-06-23 ENCOUNTER — Telehealth: Payer: 59 | Admitting: Pulmonary Disease

## 2021-06-29 ENCOUNTER — Other Ambulatory Visit: Payer: Self-pay

## 2021-06-29 ENCOUNTER — Telehealth (INDEPENDENT_AMBULATORY_CARE_PROVIDER_SITE_OTHER): Payer: Medicare Other | Admitting: Pulmonary Disease

## 2021-06-29 ENCOUNTER — Encounter: Payer: Self-pay | Admitting: Pulmonary Disease

## 2021-06-29 DIAGNOSIS — G4733 Obstructive sleep apnea (adult) (pediatric): Secondary | ICD-10-CM | POA: Diagnosis not present

## 2021-06-29 DIAGNOSIS — Z9989 Dependence on other enabling machines and devices: Secondary | ICD-10-CM | POA: Diagnosis not present

## 2021-06-29 NOTE — Progress Notes (Signed)
Virtual Visit via Video Note  I connected with Donna Colon on 06/29/21 at  9:15 AM EST by a video enabled telemedicine application and verified that I am speaking with the correct person using two identifiers.  Location: Patient: located at home Provider: located in office, [address]   I discussed the limitations of evaluation and management by telemedicine and the availability of in person appointments. The patient expressed understanding and agreed to proceed.  History of Present Illness: Patient with a history of obstructive sleep apnea  Has been using CPAP Trying to use CPAP on a nightly basis CPAP pressures of 5-10 Continues to have some difficulty with the mask, she does use an oral tape to keep her lips together  She is feeling relatively well  Managing to lose some weight, diet and exercise   Observations/Objective: She looks well on video with no significant complaints  Download from her machine reveals 70% compliance Average use on days used of 5 hours 47 minutes Machine set 5-10 95 percentile pressure of 9.5 Residual AHI of 1.0  Assessment and Plan: Obstructive sleep apnea pain is adequately treated with CPAP therapy  Issues with mask -May need a different mask but just received another mask in the last couple of days that she is going to try  Encouraged to continue weight loss efforts  Encouraged to continue regular exercises  Tentative follow-up in 6 months Encouraged to call with any significant concerns  Follow Up Instructions:    I discussed the assessment and treatment plan with the patient. The patient was provided an opportunity to ask questions and all were answered. The patient agreed with the plan and demonstrated an understanding of the instructions.   The patient was advised to call back or seek an in-person evaluation if the symptoms worsen or if the condition fails to improve as anticipated.  I provided 20 minutes of  non-face-to-face time during this encounter.   Laurin Coder, MD

## 2022-02-17 ENCOUNTER — Encounter: Payer: Self-pay | Admitting: Ophthalmology

## 2022-02-24 NOTE — Discharge Instructions (Signed)

## 2022-03-01 ENCOUNTER — Encounter: Payer: Self-pay | Admitting: Ophthalmology

## 2022-03-01 ENCOUNTER — Ambulatory Visit
Admission: RE | Admit: 2022-03-01 | Discharge: 2022-03-01 | Disposition: A | Discharge status: Home / Self Care | Payer: Medicare Other | Attending: Ophthalmology | Admitting: Ophthalmology

## 2022-03-01 ENCOUNTER — Other Ambulatory Visit: Payer: Self-pay

## 2022-03-01 ENCOUNTER — Encounter
Admission: RE | Disposition: A | Discharge status: Home / Self Care | Payer: Self-pay | Source: Home / Self Care | Attending: Ophthalmology

## 2022-03-01 ENCOUNTER — Ambulatory Visit: Payer: Medicare Other | Admitting: Anesthesiology

## 2022-03-01 DIAGNOSIS — H2512 Age-related nuclear cataract, left eye: Secondary | ICD-10-CM | POA: Diagnosis not present

## 2022-03-01 DIAGNOSIS — G4730 Sleep apnea, unspecified: Secondary | ICD-10-CM | POA: Diagnosis not present

## 2022-03-01 DIAGNOSIS — I10 Essential (primary) hypertension: Secondary | ICD-10-CM | POA: Diagnosis not present

## 2022-03-01 HISTORY — DX: Sleep apnea, unspecified: G47.30

## 2022-03-01 HISTORY — PX: CATARACT EXTRACTION W/PHACO: SHX586

## 2022-03-01 SURGERY — PHACOEMULSIFICATION, CATARACT, WITH IOL INSERTION
Anesthesia: Monitor Anesthesia Care | Site: Eye | Laterality: Left

## 2022-03-01 MED ORDER — SIGHTPATH DOSE#1 BSS IO SOLN
INTRAOCULAR | Status: DC | PRN
  Administered 2022-03-01: 15 mL

## 2022-03-01 MED ORDER — BRIMONIDINE TARTRATE-TIMOLOL 0.2-0.5 % OP SOLN
OPHTHALMIC | Status: DC | PRN
  Administered 2022-03-01: 1 [drp] via OPHTHALMIC

## 2022-03-01 MED ORDER — MIDAZOLAM HCL 2 MG/2ML IJ SOLN
INTRAMUSCULAR | Status: DC | PRN
  Administered 2022-03-01: 2 mg via INTRAVENOUS

## 2022-03-01 MED ORDER — ACETAMINOPHEN 160 MG/5ML PO SOLN: 325.0000 mg | ORAL | Status: DC | PRN

## 2022-03-01 MED ORDER — ONDANSETRON HCL 4 MG/2ML IJ SOLN: 4.0000 mg | Freq: Once | INTRAMUSCULAR | Status: DC | PRN

## 2022-03-01 MED ORDER — ACETAMINOPHEN 325 MG PO TABS: 325.0000 mg | ORAL_TABLET | ORAL | Status: DC | PRN

## 2022-03-01 MED ORDER — FENTANYL CITRATE (PF) 100 MCG/2ML IJ SOLN
INTRAMUSCULAR | Status: DC | PRN
Start: 2022-03-01 — End: 2022-03-01
  Administered 2022-03-01: 100 ug via INTRAVENOUS

## 2022-03-01 MED ORDER — TETRACAINE HCL 0.5 % OP SOLN
1.0000 [drp] | OPHTHALMIC | Status: DC | PRN
  Administered 2022-03-01 (×3): 1 [drp] via OPHTHALMIC

## 2022-03-01 MED ORDER — SIGHTPATH DOSE#1 BSS IO SOLN
INTRAOCULAR | Status: DC | PRN
  Administered 2022-03-01: 46 mL via OPHTHALMIC

## 2022-03-01 MED ORDER — SIGHTPATH DOSE#1 BSS IO SOLN
INTRAOCULAR | Status: DC | PRN
  Administered 2022-03-01: 1 mL

## 2022-03-01 MED ORDER — MOXIFLOXACIN HCL 0.5 % OP SOLN
OPHTHALMIC | Status: DC | PRN
  Administered 2022-03-01: 0.2 mL via OPHTHALMIC

## 2022-03-01 MED ORDER — ARMC OPHTHALMIC DILATING DROPS
1.0000 | OPHTHALMIC | Status: DC | PRN
  Administered 2022-03-01 (×3): 1 via OPHTHALMIC

## 2022-03-01 SURGICAL SUPPLY — 10 items
CATARACT SUITE SIGHTPATH (MISCELLANEOUS) ×2 IMPLANT
FEE CATARACT SUITE SIGHTPATH (MISCELLANEOUS) ×1 IMPLANT
GLOVE SURG ENC TEXT LTX SZ8 (GLOVE) ×2 IMPLANT
GLOVE SURG TRIUMPH 8.0 PF LTX (GLOVE) ×2 IMPLANT
LENS IOL TECNIS EYHANCE 18.5 (Intraocular Lens) ×1 IMPLANT
NDL FILTER BLUNT 18X1 1/2 (NEEDLE) ×1 IMPLANT
NEEDLE FILTER BLUNT 18X 1/2SAF (NEEDLE) ×1
NEEDLE FILTER BLUNT 18X1 1/2 (NEEDLE) ×1 IMPLANT
SYR 3ML LL SCALE MARK (SYRINGE) ×2 IMPLANT
WATER STERILE IRR 250ML POUR (IV SOLUTION) ×2 IMPLANT

## 2022-03-01 NOTE — Anesthesia Postprocedure Evaluation (Signed)
Anesthesia Post Note  Patient: Donna Colon  Procedure(s) Performed: CATARACT EXTRACTION PHACO AND INTRAOCULAR LENS PLACEMENT (IOC) LEFT 4.78 00:30.3 (Left: Eye)     Patient location during evaluation: PACU Anesthesia Type: MAC Level of consciousness: awake Pain management: pain level controlled Vital Signs Assessment: post-procedure vital signs reviewed and stable Respiratory status: respiratory function stable Cardiovascular status: stable Postop Assessment: no apparent nausea or vomiting Anesthetic complications: no   No notable events documented.  Veda Canning

## 2022-03-01 NOTE — Transfer of Care (Signed)
Immediate Anesthesia Transfer of Care Note  Patient: Donna Colon  Procedure(s) Performed: CATARACT EXTRACTION PHACO AND INTRAOCULAR LENS PLACEMENT (IOC) LEFT 4.78 00:30.3 (Left: Eye)  Patient Location: PACU  Anesthesia Type: MAC  Level of Consciousness: awake, alert  and patient cooperative  Airway and Oxygen Therapy: Patient Spontanous Breathing and Patient connected to supplemental oxygen  Post-op Assessment: Post-op Vital signs reviewed, Patient's Cardiovascular Status Stable, Respiratory Function Stable, Patent Airway and No signs of Nausea or vomiting  Post-op Vital Signs: Reviewed and stable  Complications: No notable events documented.

## 2022-03-01 NOTE — H&P (Signed)
[facility]   Primary Care Physician:  [facility] Ophthalmologist: Dr. Hortense Ramal  Pre-Procedure History & Physical: HPI:  Donna Colon is a 66 y.o. female here for cataract surgery.   Past Medical History:  Diagnosis Date   Hypertension    Sleep apnea    CPAP    Past Surgical History:  Procedure Laterality Date   EYE SURGERY Right    cataract    Prior to Admission medications   Medication Sig Start Date End Date Taking? Authorizing Provider  aspirin EC 81 MG tablet Take 81 mg by mouth daily.   Yes [provider]  ferrous sulfate 325 (65 FE) MG tablet Take 325 mg by mouth daily with breakfast.   Yes [provider]  Magnesium 500 MG TABS Take by mouth daily.   Yes [provider]  Multiple Vitamin (MULTIVITAMIN) capsule Take 1 capsule by mouth daily.   Yes [provider]    Allergies as of 01/27/2022 - Review Complete 06/29/2021  Allergen Reaction Noted   Codeine Rash 05/02/2014    Family History  Problem Relation Age of Onset   Heart failure Mother    Cancer Father     Social History   Socioeconomic History   Marital status: Divorced    Spouse name: Not on file   Number of children: Not on file   Years of education: Not on file   Highest education level: Not on file  Occupational History   Not on file  Tobacco Use   Smoking status: Never   Smokeless tobacco: Never  Vaping Use   Vaping Use: Never used  Substance and Sexual Activity   Alcohol use: No   Drug use: No   Sexual activity: Not on file  Other Topics Concern   Not on file  Social History Narrative   Not on file   Social Determinants of Health   Financial Resource Strain: Not on file  Food Insecurity: Not on file  Transportation Needs: Not on file  Physical Activity: Not on file  Stress: Not on file  Social Connections: Not on file  Intimate Partner Violence: Not on file    Review of Systems: See HPI,  otherwise negative ROS  Physical Exam: BP (!) 158/75   Pulse 77   Temp 97.9 F (36.6 C) (Temporal)   Resp 16   Ht '5\' 3"'$  (1.6 m)   Wt 74.4 kg   SpO2 98%   BMI 29.05 kg/m  General:   Alert, cooperative in NAD Head:  Normocephalic and atraumatic. Respiratory:  Normal work of breathing. Cardiovascular:  RRR  Impression/Plan: Donna Colon is here for cataract surgery.  Risks, benefits, limitations, and alternatives regarding cataract surgery have been reviewed with the patient.  Questions have been answered.  All parties agreeable.   Birder Robson, MD  03/01/2022, 9:47 AM

## 2022-03-01 NOTE — Anesthesia Preprocedure Evaluation (Signed)
Anesthesia Evaluation  Patient identified by MRN, date of birth, ID band Patient awake    Reviewed: Allergy & Precautions, NPO status   Airway Mallampati: II  TM Distance: >3 FB     Dental   Pulmonary sleep apnea and Continuous Positive Airway Pressure Ventilation ,    Pulmonary exam normal        Cardiovascular hypertension,  Rhythm:Regular Rate:Normal     Neuro/Psych    GI/Hepatic   Endo/Other    Renal/GU      Musculoskeletal   Abdominal   Peds  Hematology   Anesthesia Other Findings   Reproductive/Obstetrics                             Anesthesia Physical Anesthesia Plan  ASA: 2  Anesthesia Plan: MAC   Post-op Pain Management: Minimal or no pain anticipated   Induction: Intravenous  PONV Risk Score and Plan: 2 and TIVA, Midazolam and Treatment may vary due to age or medical condition  Airway Management Planned: Natural Airway and Nasal Cannula  Additional Equipment:   Intra-op Plan:   Post-operative Plan:   Informed Consent: I have reviewed the patients History and Physical, chart, labs and discussed the procedure including the risks, benefits and alternatives for the proposed anesthesia with the patient or authorized representative who has indicated his/her understanding and acceptance.       Plan Discussed with: CRNA  Anesthesia Plan Comments:         Anesthesia Quick Evaluation

## 2022-03-01 NOTE — Op Note (Signed)
PREOPERATIVE DIAGNOSIS:  Nuclear sclerotic cataract of the left eye.   POSTOPERATIVE DIAGNOSIS:  Nuclear sclerotic cataract of the left eye.   OPERATIVE PROCEDURE:ORPROCALL@   SURGEON:  Birder Robson, MD.   ANESTHESIA:  Anesthesiologist: Veda Canning, MD CRNA: Mayme Genta, CRNA; Izetta Dakin, CRNA  1.      Managed anesthesia care. 2.     0.33m of Shugarcaine was instilled following the paracentesis   COMPLICATIONS:  None.   TECHNIQUE:   Stop and chop   DESCRIPTION OF PROCEDURE:  The patient was examined and consented in the preoperative holding area where the aforementioned topical anesthesia was applied to the left eye and then brought back to the Operating Room where the left eye was prepped and draped in the usual sterile ophthalmic fashion and a lid speculum was placed. A paracentesis was created with the side port blade and the anterior chamber was filled with viscoelastic. A near clear corneal incision was performed with the steel keratome. A continuous curvilinear capsulorrhexis was performed with a cystotome followed by the capsulorrhexis forceps. Hydrodissection and hydrodelineation were carried out with BSS on a blunt cannula. The lens was removed in a stop and chop  technique and the remaining cortical material was removed with the irrigation-aspiration handpiece. The capsular bag was inflated with viscoelastic and the Technis ZCB00 lens was placed in the capsular bag without complication. The remaining viscoelastic was removed from the eye with the irrigation-aspiration handpiece. The wounds were hydrated. The anterior chamber was flushed with BSS and the eye was inflated to physiologic pressure. 0.158mVigamox was placed in the anterior chamber. The wounds were found to be water tight. The eye was dressed with Combigan. The patient was given protective glasses to wear throughout the day and a shield with which to sleep tonight. The patient was also given drops with which  to begin a drop regimen today and will follow-up with me in one day. Implant Name Type Inv. Item Serial No. Manufacturer Lot No. LRB No. Used Action  LENS IOL TECNIS EYHANCE 18.5 - [id number] Lens LENS IOL TECNIS EYHANCE 18.5 [id number]  Left 1 Implanted    Procedure(s) with comments: CATARACT EXTRACTION PHACO AND INTRAOCULAR LENS PLACEMENT (IOC) LEFT 4.78 00:30.3 (Left) - sleep apnea  Electronically signed: WiBirder Robson/25/2023 10:10 AM

## 2022-03-02 ENCOUNTER — Encounter: Payer: Self-pay | Admitting: Ophthalmology

## 2022-04-27 ENCOUNTER — Ambulatory Visit (INDEPENDENT_AMBULATORY_CARE_PROVIDER_SITE_OTHER): Payer: Medicare Other | Admitting: Pulmonary Disease

## 2022-04-27 ENCOUNTER — Encounter: Payer: Self-pay | Admitting: Pulmonary Disease

## 2022-04-27 VITALS — BP 124/70 | HR 72 | Temp 98.9°F | Ht 63.0 in | Wt 165.0 lb

## 2022-04-27 DIAGNOSIS — G4719 Other hypersomnia: Secondary | ICD-10-CM

## 2022-04-27 DIAGNOSIS — G4733 Obstructive sleep apnea (adult) (pediatric): Secondary | ICD-10-CM

## 2022-04-27 NOTE — Progress Notes (Signed)
Donna Colon    [id number]    10-29-1955  Primary Care Physician:[facility]  Referring Physician: [facility] [place],  [place] [id number]  Chief complaint:   History of obstructive sleep apnea  HPI:  Has not been able to tolerate CPAP nightly She also does have an oral device that she tries to use that does not seem to be working well  Last sleep study was in 2021 showing mild obstructive sleep apnea  She is on auto CPAP set between 5 and 10  She is told that she still snores Poor sleep efficiency  We will usually go to bed about 9 PM, will wake up by 07/08/2011 May stay up for couple of hours and then fall asleep again usually out of the bed by 5 to 6 AM  She is not feeling restored She has daytime sleepiness Nasal congestion sometimes  Sometimes wakes up with palpitations  Weight is stable, health is stable overall  Memory is good, focus is good  Never smoker No alcohol use  No pertinent occupational history  Outpatient Encounter Medications as of 04/27/2022  Medication Sig   aspirin EC 81 MG tablet Take 81 mg by mouth daily.   cetirizine (ZYRTEC) 10 MG tablet Take 10 mg by mouth daily.   ferrous sulfate 325 (65 FE) MG tablet Take 325 mg by mouth daily with breakfast.   Magnesium 500 MG TABS Take by mouth daily.   Multiple Vitamin (MULTIVITAMIN) capsule Take 1 capsule by mouth daily.   [DISCONTINUED] doxycycline (VIBRAMYCIN) 100 MG capsule Take 100 mg by mouth 2 (two) times daily. (Patient not taking: Reported on 04/27/2022)   [DISCONTINUED] tobramycin-dexamethasone (TOBRADEX) ophthalmic solution SMARTSIG:In Eye(s) (Patient not taking: Reported on 04/27/2022)   No facility-administered encounter medications on file as of 04/27/2022.    Allergies as of 04/27/2022 - Review Complete 04/27/2022  Allergen Reaction Noted   Codeine Rash 05/02/2014    Past Medical History:  Diagnosis  Date   Hypertension    Sleep apnea    CPAP    Past Surgical History:  Procedure Laterality Date   CATARACT EXTRACTION W/PHACO Left 03/01/2022   Procedure: CATARACT EXTRACTION PHACO AND INTRAOCULAR LENS PLACEMENT (IOC) LEFT 4.78 00:30.3;  Surgeon: Birder Robson, MD;  Location: [facility];  Service: Ophthalmology;  Laterality: Left;  sleep apnea   EYE SURGERY Right    cataract    Family History  Problem Relation Age of Onset   Heart failure Mother    Cancer Father     Social History   Socioeconomic History   Marital status: Divorced    Spouse name: Not on file   Number of children: Not on file   Years of education: Not on file   Highest education level: Not on file  Occupational History   Not on file  Tobacco Use   Smoking status: Never   Smokeless tobacco: Never  Vaping Use   Vaping Use: Never used  Substance and Sexual Activity   Alcohol use: No   Drug use: No   Sexual activity: Not on file  Other Topics Concern   Not on file  Social History Narrative   Not on file   Social Determinants of Health   Financial Resource Strain: Not on file  Food Insecurity: Not on file  Transportation Needs: Not on file  Physical Activity: Not on file  Stress: Not on file  Social Connections: Not on file  Intimate Partner Violence: Not on file    Review of Systems  Respiratory:  Positive for apnea.   Psychiatric/Behavioral:  Positive for sleep disturbance.   All other systems reviewed and are negative.   Vitals:   04/27/22 0900  BP: 124/70  Pulse: 72  Temp: 98.9 F (37.2 C)  SpO2: 98%     Physical Exam Constitutional:      Appearance: Normal appearance. She is well-developed.  HENT:     Head: Normocephalic and atraumatic.  Eyes:     Conjunctiva/sclera: Conjunctivae normal.  Neck:     Thyroid: No thyromegaly.     Trachea: No tracheal deviation.  Cardiovascular:     Rate and Rhythm: Normal rate and regular rhythm.  Pulmonary:     Effort:  Pulmonary effort is normal. No respiratory distress.     Breath sounds: Normal breath sounds. No wheezing or rales.  Chest:     Chest wall: No tenderness.  Musculoskeletal:        General: Normal range of motion.     Cervical back: Normal range of motion and neck supple.  Skin:    General: Skin is warm.  Neurological:     Mental Status: She is alert.  Psychiatric:        Mood and Affect: Mood normal.       12/04/2019   10:00 AM  Results of the Epworth flowsheet  Sitting and reading 3  Watching TV 3  Sitting, inactive in a public place (e.g. a theatre or a meeting) 0  As a passenger in a car for an hour without a break 2  Lying down to rest in the afternoon when circumstances permit 2  Sitting and talking to someone 3  Sitting quietly after a lunch without alcohol 2  In a car, while stopped for a few minutes in traffic 1  Total score 16    Previous study not available for review  Compliance data shows 17% compliance with average use of 3 hours 55 minutes AutoSet 5-10 95 percentile pressure of 8.3 Residual AHI of 1.2  She does try to use oral device on some nights  Assessment:  History of mild obstructive sleep apnea -Not tolerating CPAP well  Oral device does not seem to be working as well  Sleep onset and sleep maintenance insomnia -Has tried melatonin Did not work well -Previously tried sleep aid also made her to feel too groggy  Excessive daytime sleepiness  Pathophysiology of sleep disordered breathing discussed Treatment options for sleep disordered breathing discussed  Plan/Recommendations:  We did discuss an inspire device today -We will not qualify for an inspire device based on having only mild obstructive sleep apnea -I will reevaluate with a home sleep study to assess for severity of sleep disordered breathing  Other treatment options discussed  Suboptimally treated sleep disordered breathing definitely contributing to daytime  sleepiness  Encourage weight loss efforts  Follow-up in 3 months  Encouraged to call with significant concerns  Sherrilyn Rist MD [facility] 04/27/2022, 9:20 AM  CC: [facility]*

## 2022-04-27 NOTE — Patient Instructions (Signed)
History of obstructive sleep apnea  Repeat home sleep study  Contact your dentist, adjustment of the oral device/new oral device  Continue trying to use your CPAP  If a repeat home sleep study shows moderate sleep apnea, you may qualify for an inspire device as we discussed  I will see you back in about 3 months  Call with significant concerns

## 2022-04-27 NOTE — Addendum Note (Signed)
Addended by: Dessie Coma on: 04/27/2022 09:31 AM   Modules accepted: Orders

## 2022-04-29 ENCOUNTER — Telehealth: Payer: Self-pay | Admitting: Pulmonary Disease

## 2022-04-29 DIAGNOSIS — G4733 Obstructive sleep apnea (adult) (pediatric): Secondary | ICD-10-CM

## 2022-04-29 NOTE — Telephone Encounter (Signed)
Called and spoke with pt letting her know that we were going to adjust her cpap pressure and she verbalized understanding. Order placed. Nothing further needed.

## 2022-04-29 NOTE — Telephone Encounter (Signed)
Called and spoke with pt who states she feels like the cpap pressure might be too much as it is blowing out a force of air. States when she puts the mask on, the air that happens will blow the mask off pt's face.  Pt is wanting to know what might be recommended if the settings might need to be changed. I have printed a download to be reviewed. Due to Dr. Jenetta Downer not being in office today, I am going to have BW review the download since she has seen the pt prior even though pt has not seen BW since 07/2020 due to [facility] seeing sleep pts.  Beth, please advise what you recommend for pt. Pt was last seen by Dr. Jenetta Downer 9/20.

## 2022-04-29 NOTE — Telephone Encounter (Signed)
Patient would like someone to check her CPAP machine- states she thinks the pressure is too high.  Please advise.

## 2022-04-29 NOTE — Telephone Encounter (Signed)
We can change CPAP pressure to 8cm h20   [facility] download 03/30/22-04/28/22 Pressure 5-10cm h20 (8.4cm h20-95%) AHI 1.3

## 2022-05-08 ENCOUNTER — Encounter: Payer: Self-pay | Admitting: Emergency Medicine

## 2022-05-08 ENCOUNTER — Ambulatory Visit
Admission: EM | Admit: 2022-05-08 | Discharge: 2022-05-08 | Disposition: A | Discharge status: Home / Self Care | Payer: Medicare Other | Attending: Internal Medicine | Admitting: Internal Medicine

## 2022-05-08 DIAGNOSIS — D1721 Benign lipomatous neoplasm of skin and subcutaneous tissue of right arm: Secondary | ICD-10-CM | POA: Diagnosis not present

## 2022-05-08 DIAGNOSIS — K047 Periapical abscess without sinus: Secondary | ICD-10-CM

## 2022-05-08 MED ORDER — AMOXICILLIN 500 MG PO CAPS: 500.0000 mg | ORAL_CAPSULE | Freq: Three times a day (TID) | ORAL | 0 refills | Status: DC

## 2022-05-08 NOTE — Discharge Instructions (Addendum)
You were seen today for dental pain.  You have an abscessed tooth.  I am putting you on antibiotics 3 times daily for the next 10 days.  You may try salt water gargles and ibuprofen as needed for pain and inflammation.  Please follow-up with your dentist for further evaluation.  I have also referred you to a general surgeon to consider excision of the lipoma on your right shoulder.  You will need to call and schedule this appointment yourself.

## 2022-05-08 NOTE — ED Triage Notes (Signed)
Patient c/o right upper tooth pain for the past 4-5 days.  Patient denies fevers.

## 2022-05-08 NOTE — ED Provider Notes (Signed)
[facility]    CSN: [id number] Arrival date & time: 05/08/22  4982      History   Chief Complaint Chief Complaint  Patient presents with   Dental Pain    HPI Donna Colon is a 66 y.o. female with history of HTN and sleep apnea, presents to the urgent care today with complaint of dental pain.  She reports this started 5 days ago.  She describes the pain as throbbing.  She reports she can taste infection in her mouth.  She has not noticed any facial swelling, fever, chills, nausea or vomiting.  She did call the dentist however was not able to get an appointment for 2 months.  She has tried Ibuprofen OTC with minimal relief of symptoms.  She would also like a referral to a Education officer, environmental.  She reports a mass to her right shoulder that has been there for "years".  She reports she was advised in the past that this was a benign tumor.  She does not feel like it has gotten bigger in size however it does cause her discomfort.  She denies overt pain, decrease in range of motion of the shoulder, numbness or tingling of her right upper extremity.  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      Home Medications    Prior to Admission medications   Medication Sig Start Date End Date Taking? Authorizing Provider  amoxicillin (AMOXIL) 500 MG capsule Take 1 capsule (500 mg  total) by mouth 3 (three) times daily. 05/08/22  Yes Jearld Fenton, NP  aspirin EC 81 MG tablet Take 81 mg by mouth daily.   Yes [provider]  cetirizine (ZYRTEC) 10 MG tablet Take 10 mg by mouth daily. 01/28/22  Yes [provider]  ferrous sulfate 325 (65 FE) MG tablet Take 325 mg by mouth daily with breakfast.   Yes [provider]  Magnesium 500 MG TABS Take by mouth daily.   Yes [provider]  Multiple Vitamin (MULTIVITAMIN) capsule Take 1 capsule by mouth daily.   Yes [provider]    Family History Family History  Problem Relation Age of Onset   Heart failure Mother    Cancer Father     Social History Social History   Tobacco Use   Smoking status: Never   Smokeless tobacco: Never  Vaping Use   Vaping Use: Never used  Substance Use Topics   Alcohol use: No   Drug use: No     Allergies   Codeine   Review of Systems Review of Systems  Past Medical History:  Diagnosis Date   Hypertension    Sleep apnea    CPAP    No current facility-administered medications for this encounter.   Current Outpatient Medications  Medication Sig Dispense Refill   amoxicillin (  AMOXIL) 500 MG capsule Take 1 capsule (500 mg total) by mouth 3 (three) times daily. 30 capsule 0   aspirin EC 81 MG tablet Take 81 mg by mouth daily.     cetirizine (ZYRTEC) 10 MG tablet Take 10 mg by mouth daily.     ferrous sulfate 325 (65 FE) MG tablet Take 325 mg by mouth daily with breakfast.     Magnesium 500 MG TABS Take by mouth daily.     Multiple Vitamin (MULTIVITAMIN) capsule Take 1 capsule by mouth daily.      Allergies  Allergen Reactions   Codeine Rash    Family History  Problem Relation Age of Onset   Heart failure Mother    Cancer Father     Social History   Socioeconomic History   Marital status: Divorced    Spouse name: Not on file   Number of children: Not on file   Years of education: Not on file   Highest education  level: Not on file  Occupational History   Not on file  Tobacco Use   Smoking status: Never   Smokeless tobacco: Never  Vaping Use   Vaping Use: Never used  Substance and Sexual Activity   Alcohol use: No   Drug use: No   Sexual activity: Not on file  Other Topics Concern   Not on file  Social History Narrative   Not on file   Social Determinants of Health   Financial Resource Strain: Not on file  Food Insecurity: Not on file  Transportation Needs: Not on file  Physical Activity: Not on file  Stress: Not on file  Social Connections: Not on file  Intimate Partner Violence: Not on file     Constitutional: Denies fever, malaise, fatigue, headache or abrupt weight changes.  HEENT: Patient reports dental pain.  Denies eye pain, eye redness, ear pain, ringing in the ears, wax buildup, runny nose, nasal congestion, bloody nose, or sore throat. Respiratory: Denies difficulty breathing, shortness of breath, cough or sputum production.   Cardiovascular: Denies chest pain, chest tightness, palpitations or swelling in the hands or feet.  Musculoskeletal: Denies decrease in range of motion, difficulty with gait, muscle pain or joint pain and swelling.  Skin: Patient reports mass of right shoulder.  Denies redness, rashes, or ulcercations.   No other specific complaints in a complete review of systems (except as listed in HPI above).   Physical Exam Triage Vital Signs ED Triage Vitals  Enc Vitals Group     BP 05/08/22 0914 (!) 158/77     Pulse Rate 05/08/22 0914 81     Resp 05/08/22 0914 14     Temp 05/08/22 0914 98.7 F (37.1 C)     Temp Source 05/08/22 0914 Oral     SpO2 05/08/22 0914 95 %     Weight 05/08/22 0912 165 lb (74.8 kg)     Height 05/08/22 0912 '5\' 3"'$  (1.6 m)     Head Circumference --      Peak Flow --      Pain Score 05/08/22 0912 10     Pain Loc --      Pain Edu? --      Excl. in [place]? --    No data found.  Updated Vital Signs BP (!) 158/77 (BP Location:  Left Arm)   Pulse 81   Temp 98.7 F (37.1 C) (Oral)   Resp 14   Ht '5\' 3"'$  (1.6 m)   Wt 165 lb (  74.8 kg)   SpO2 95%   BMI 29.23 kg/m      Physical Exam  BP (!) 158/77 (BP Location: Left Arm)   Pulse 81   Temp 98.7 F (37.1 C) (Oral)   Resp 14   Ht '5\' 3"'$  (1.6 m)   Wt 165 lb (74.8 kg)   SpO2 95%   BMI 29.23 kg/m  Wt Readings from Last 3 Encounters:  05/08/22 165 lb (74.8 kg)  04/27/22 165 lb (74.8 kg)  03/01/22 164 lb (74.4 kg)    General: Appears her stated age, overweight, in NAD. Skin: 5 cm x 4 cm lipoma  overlying the right shoulder blade. HEENT: Head: normal shape and size; Throat/Mouth: Multiple cavities present with fillings.  She has a cavity at tooth #4 with a partial filling, swelling noted at the gumline. Neck: No adenopathy noted. Cardiovascular: Normal rate and rhythm. S1,S2 noted.  No murmur, rubs or gallops noted.  Pulmonary/Chest: Normal effort and positive vesicular breath sounds. No respiratory distress. No wheezes, rales or ronchi noted.  Neurological: Alert and oriented.    BMET    Component Value Date/Time   NA 141 10/14/2016 1302   NA 144 08/07/2012 1508   K 3.7 10/14/2016 1302   K 3.7 08/07/2012 1508   CL 103 10/14/2016 1302   CL 109 (H) 08/07/2012 1508   CO2 29 10/14/2016 1302   CO2 28 08/07/2012 1508   GLUCOSE 98 10/14/2016 1302   GLUCOSE 90 08/07/2012 1508   BUN 10 10/14/2016 1302   BUN 7 08/07/2012 1508   CREATININE 0.74 10/14/2016 1302   CREATININE 0.74 08/07/2012 1508   CALCIUM 9.4 10/14/2016 1302   CALCIUM 8.9 08/07/2012 1508   GFRNONAA >60 10/14/2016 1302   GFRNONAA >60 08/07/2012 1508   GFRAA >60 10/14/2016 1302   GFRAA >60 08/07/2012 1508    Lipid Panel  No results found for: "CHOL", "TRIG", "HDL", "CHOLHDL", "VLDL", "LDLCALC"  CBC    Component Value Date/Time   WBC 6.5 05/01/2015 0940   RBC 4.41 05/01/2015 0940   HGB 14.0 05/01/2015 0940   HGB 14.4 08/07/2012 1508   HCT 41.5 05/01/2015 0940   HCT 42.0 08/07/2012  1508   PLT 275 05/01/2015 0940   PLT 286 08/07/2012 1508   MCV 94.0 05/01/2015 0940   MCV 94 08/07/2012 1508   MCH 31.7 05/01/2015 0940   MCHC 33.7 05/01/2015 0940   RDW 13.5 05/01/2015 0940   RDW 13.2 08/07/2012 1508   LYMPHSABS 2.0 05/01/2015 0940   MONOABS 0.4 05/01/2015 0940   EOSABS 0.1 05/01/2015 0940   BASOSABS 0.0 05/01/2015 0940    Hgb A1C No results found for: "HGBA1C"     UC Treatments / Results  Medications Ordered in UC Meds ordered this encounter  Medications   amoxicillin (AMOXIL) 500 MG capsule    Sig: Take 1 capsule (500 mg total) by mouth 3 (three) times daily.    Dispense:  30 capsule    Refill:  0    Order Specific Question:   Supervising Provider    Answer:   Chase Picket [id number]     Initial Impression / Assessment and Plan / UC Course  I have reviewed the triage vital signs and the nursing notes.  Pertinent labs & imaging results that were available during my care of the patient were reviewed by me and considered in my medical decision making (see chart for details).    Dental Abscess:  Rx for Amoxicillin 500 mg 3  times daily for 10 days Encourage salt water gargles Okay to continue Ibuprofen 800 mg every 8 hours as needed for pain and swelling She will follow-up with her dentist as an outpatient  Lipoma, Right Shoulder:  Referral to general surgery placed for consideration of surgical excision   Final Clinical Impressions(s) / UC Diagnoses   Final diagnoses:  Dental abscess  Lipoma of right shoulder     Discharge Instructions      You were seen today for dental pain.  You have an abscessed tooth.  I am putting you on antibiotics 3 times daily for the next 10 days.  You may try salt water gargles and ibuprofen as needed for pain and inflammation.  Please follow-up with your dentist for further evaluation.  I have also referred you to a general surgeon to consider excision of the lipoma on your right shoulder.  You will need  to call and schedule this appointment yourself.     ED Prescriptions     Medication Sig Dispense Auth. Provider   amoxicillin (AMOXIL) 500 MG capsule Take 1 capsule (500 mg total) by mouth 3 (three) times daily. 30 capsule Jearld Fenton, NP      PDMP not reviewed this encounter.   Jearld Fenton, NP 05/08/22 [phone]

## 2022-07-08 DIAGNOSIS — D1721 Benign lipomatous neoplasm of skin and subcutaneous tissue of right arm: Secondary | ICD-10-CM

## 2022-07-08 HISTORY — DX: Benign lipomatous neoplasm of skin and subcutaneous tissue of right arm: D17.21

## 2022-07-15 ENCOUNTER — Ambulatory Visit: Payer: Self-pay | Admitting: General Surgery

## 2022-07-15 NOTE — H&P (View-Only) (Signed)
PATIENT PROFILE: Donna Colon is a 66 y.o. female who presents to the Clinic for consultation at the request of Dr. Pasty Arch for evaluation of right shoulder lipoma.  PCP:  Dept, [place]  HISTORY OF PRESENT ILLNESS: Donna Colon reports she has had a right shoulder lipoma for a few years.  He endorses that initially was very small.  It has been growing in size.  He has been also causing more pain now that is bigger.  Pain exacerbated by pressure and with the bra.  No alleviating factors.  Denies previous history of infection.   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   Overview    Overview:  Atrial Premature Complex       GENERAL REVIEW OF SYSTEMS:   General ROS: negative for - chills, fatigue, fever, weight gain or weight loss Allergy and Immunology ROS: negative for - hives  Hematological and Lymphatic ROS: negative for - bleeding problems or bruising, negative for palpable nodes Endocrine ROS: negative for - heat or cold intolerance, hair changes Respiratory ROS: negative for - cough, shortness of breath or wheezing Cardiovascular ROS: no chest pain or palpitations GI ROS: negative for nausea, vomiting, abdominal pain, diarrhea, constipation Musculoskeletal ROS: negative for - joint swelling or muscle pain Neurological ROS: negative for - confusion, syncope Dermatological ROS: negative for pruritus and rash Psychiatric: negative for anxiety, depression, difficulty sleeping and memory loss  MEDICATIONS: Current Outpatient Medications  Medication Sig Dispense Refill   amLODIPine (NORVASC) 5 MG tablet Take 5 mg by mouth once daily     aspirin 81 MG EC tablet Take 81 mg by mouth once daily     ferrous sulfate 325 (65 FE) MG tablet Take 325 mg by mouth daily with breakfast     magnesium oxide  (MAG-OX) 400 mg (241.3 mg magnesium) tablet Take 400 mg by mouth once daily     multivitamin capsule Take 1 capsule by mouth once daily     penicillin v potassium 500 MG tablet 1 tablet Orally Twice a day for 10 day(s)     No current facility-administered medications for this visit.    ALLERGIES: Amoxicillin, Prednisone, Sulfa (sulfonamide antibiotics), and Codeine  PAST MEDICAL HISTORY: Past Medical History:  Diagnosis Date   HTN (hypertension)    MVP (mitral valve prolapse) 2012    PAST SURGICAL HISTORY: Past Surgical History:  Procedure Laterality Date   CATARACT EXTRACTION Bilateral    rt 2015  lt 2023   OOPHORECTOMY Right    around [place]: History reviewed. No pertinent family history.   SOCIAL HISTORY: Social History   Socioeconomic History   Marital status: Divorced  Tobacco Use   Smoking status: Never    Passive exposure: Never   Smokeless tobacco: Never  Vaping Use   Vaping Use: Never used  Substance and Sexual Activity   Alcohol use: Never   Drug use: Never    PHYSICAL EXAM: Vitals:   07/15/22 0833  BP: (!) 156/81  Pulse: 98   Body mass index is 29.23 kg/m. Weight: 74.8 kg (165 lb)   GENERAL: Alert, active, oriented x3  HEENT: Pupils equal reactive to light. Extraocular movements are intact. Sclera clear. Palpebral conjunctiva normal red color.Pharynx clear.  NECK: Supple with  no palpable mass and no adenopathy.  LUNGS: Sound clear with no rales rhonchi or wheezes.  HEART: Regular rhythm S1 and S2 without murmur.  ABDOMEN: Soft and depressible, nontender with no palpable mass, no hepatomegaly. Wounds dry and clean.  EXTREMITIES: Well-developed well-nourished symmetrical with no dependent edema.  There is a 5.5 cm soft tissue mass in the right shoulder area.  Oval-shaped, rubbery, mobile.  No skin changes on top of the mass.  No sign of infection.  No fluid collection.  NEUROLOGICAL: Awake alert oriented, facial expression  symmetrical, moving all extremities.  REVIEW OF DATA: I have reviewed the following data today: No visits with results within 3 Month(s) from this visit.  Latest known visit with results is:  No results found for any previous visit.     ASSESSMENT: Donna Colon is a 66 y.o. female presenting for consultation for lipoma of the right shoulder. The patient has a mass on the right shoulder that is causing some pain and discomfort on pressure. Patient oriented about the diagnosis of lipoma, not a malignant lesion but if it is causing symptoms, excision can be considered. Patient oriented about the procedure, benefits and risk.   Lipoma of shoulder [D17.20]  PLAN: 1.  Excision of right shoulder soft tissue mass (11406, 12032) 2. Hold aspirin 5 days before procedure 3. Contact us if you have any question or concern.   Patient verbalized understanding, all questions were answered, and were agreeable with the plan outlined above.   Herbert Pun, MD  Electronically signed by Herbert Pun, MD

## 2022-07-15 NOTE — H&P (Signed)
PATIENT PROFILE: Donna Colon is a 66 y.o. female who presents to the Clinic for consultation at the request of Dr. Pasty Arch for evaluation of right shoulder lipoma.  PCP:  Dept, [place]  HISTORY OF PRESENT ILLNESS: Donna Colon reports she has had a right shoulder lipoma for a few years.  He endorses that initially was very small.  It has been growing in size.  He has been also causing more pain now that is bigger.  Pain exacerbated by pressure and with the bra.  No alleviating factors.  Denies previous history of infection.   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   Overview    Overview:  Atrial Premature Complex       GENERAL REVIEW OF SYSTEMS:   General ROS: negative for - chills, fatigue, fever, weight gain or weight loss Allergy and Immunology ROS: negative for - hives  Hematological and Lymphatic ROS: negative for - bleeding problems or bruising, negative for palpable nodes Endocrine ROS: negative for - heat or cold intolerance, hair changes Respiratory ROS: negative for - cough, shortness of breath or wheezing Cardiovascular ROS: no chest pain or palpitations GI ROS: negative for nausea, vomiting, abdominal pain, diarrhea, constipation Musculoskeletal ROS: negative for - joint swelling or muscle pain Neurological ROS: negative for - confusion, syncope Dermatological ROS: negative for pruritus and rash Psychiatric: negative for anxiety, depression, difficulty sleeping and memory loss  MEDICATIONS: Current Outpatient Medications  Medication Sig Dispense Refill   amLODIPine (NORVASC) 5 MG tablet Take 5 mg by mouth once daily     aspirin 81 MG EC tablet Take 81 mg by mouth once daily     ferrous sulfate 325 (65 FE) MG tablet Take 325 mg by mouth daily with breakfast     magnesium oxide  (MAG-OX) 400 mg (241.3 mg magnesium) tablet Take 400 mg by mouth once daily     multivitamin capsule Take 1 capsule by mouth once daily     penicillin v potassium 500 MG tablet 1 tablet Orally Twice a day for 10 day(s)     No current facility-administered medications for this visit.    ALLERGIES: Amoxicillin, Prednisone, Sulfa (sulfonamide antibiotics), and Codeine  PAST MEDICAL HISTORY: Past Medical History:  Diagnosis Date   HTN (hypertension)    MVP (mitral valve prolapse) 2012    PAST SURGICAL HISTORY: Past Surgical History:  Procedure Laterality Date   CATARACT EXTRACTION Bilateral    rt 2015  lt 2023   OOPHORECTOMY Right    around [place]: History reviewed. No pertinent family history.   SOCIAL HISTORY: Social History   Socioeconomic History   Marital status: Divorced  Tobacco Use   Smoking status: Never    Passive exposure: Never   Smokeless tobacco: Never  Vaping Use   Vaping Use: Never used  Substance and Sexual Activity   Alcohol use: Never   Drug use: Never    PHYSICAL EXAM: Vitals:   07/15/22 0833  BP: (!) 156/81  Pulse: 98   Body mass index is 29.23 kg/m. Weight: 74.8 kg (165 lb)   GENERAL: Alert, active, oriented x3  HEENT: Pupils equal reactive to light. Extraocular movements are intact. Sclera clear. Palpebral conjunctiva normal red color.Pharynx clear.  NECK: Supple with  no palpable mass and no adenopathy.  LUNGS: Sound clear with no rales rhonchi or wheezes.  HEART: Regular rhythm S1 and S2 without murmur.  ABDOMEN: Soft and depressible, nontender with no palpable mass, no hepatomegaly. Wounds dry and clean.  EXTREMITIES: Well-developed well-nourished symmetrical with no dependent edema.  There is a 5.5 cm soft tissue mass in the right shoulder area.  Oval-shaped, rubbery, mobile.  No skin changes on top of the mass.  No sign of infection.  No fluid collection.  NEUROLOGICAL: Awake alert oriented, facial expression  symmetrical, moving all extremities.  REVIEW OF DATA: I have reviewed the following data today: No visits with results within 3 Month(s) from this visit.  Latest known visit with results is:  No results found for any previous visit.     ASSESSMENT: Donna Colon is a 66 y.o. female presenting for consultation for lipoma of the right shoulder. The patient has a mass on the right shoulder that is causing some pain and discomfort on pressure. Patient oriented about the diagnosis of lipoma, not a malignant lesion but if it is causing symptoms, excision can be considered. Patient oriented about the procedure, benefits and risk.   Lipoma of shoulder [D17.20]  PLAN: 1.  Excision of right shoulder soft tissue mass (11406, 12032) 2. Hold aspirin 5 days before procedure 3. Contact us if you have any question or concern.   Patient verbalized understanding, all questions were answered, and were agreeable with the plan outlined above.   Herbert Pun, MD  Electronically signed by Herbert Pun, MD

## 2022-07-26 ENCOUNTER — Encounter
Admission: RE | Admit: 2022-07-26 | Discharge: 2022-07-26 | Disposition: A | Discharge status: Home / Self Care | Payer: Medicare Other | Source: Ambulatory Visit | Attending: General Surgery | Admitting: General Surgery

## 2022-07-26 VITALS — Ht 63.0 in | Wt 165.0 lb

## 2022-07-26 DIAGNOSIS — I10 Essential (primary) hypertension: Secondary | ICD-10-CM

## 2022-07-26 DIAGNOSIS — Z01812 Encounter for preprocedural laboratory examination: Secondary | ICD-10-CM

## 2022-07-26 HISTORY — DX: Atrial premature depolarization: I49.1

## 2022-07-26 NOTE — Patient Instructions (Addendum)
Your procedure is scheduled on: Wednesday, December 27 Report to the Registration Desk on the 1st floor of the [facility]. To find out your arrival time, please call [phone] between 1PM - 3PM on: Tuesday, December 26 If your arrival time is 6:00 am, do not arrive prior to that time as the [facility] entrance doors do not open until 6:00 am.  REMEMBER: Instructions that are not followed completely may result in serious medical risk, up to and including death; or upon the discretion of your surgeon and anesthesiologist your surgery may need to be rescheduled.  Do not eat or drink after midnight the night before surgery.  No gum chewing, lozengers or hard candies.  DO NOT TAKE ANY MEDICATIONS THE MORNING OF SURGERY  Aspirin - hold 5 days prior to surgery.  One week prior to surgery: starting December 20 Stop Anti-inflammatories (NSAIDS) such as Advil, Aleve, Ibuprofen, Motrin, Naproxen, Naprosyn and Aspirin based products such as Excedrin, Goodys Powder, BC Powder. Stop ANY OVER THE COUNTER supplements until after surgery.  You may however, continue to take Tylenol if needed for pain up until the day of surgery.  No Alcohol for 24 hours before or after surgery.  No Smoking including e-cigarettes for 24 hours prior to surgery.  No chewable tobacco products for at least 6 hours prior to surgery.  No nicotine patches on the day of surgery.  Do not use any "recreational" drugs for at least a week prior to your surgery.  Please be advised that the combination of cocaine and anesthesia may have negative outcomes, up to and including death. If you test positive for cocaine, your surgery will be cancelled.  On the morning of surgery brush your teeth with toothpaste and water, you may rinse your mouth with mouthwash if you wish. Do not swallow any toothpaste or mouthwash.  Use CHG Soap as directed on instruction sheet.  Do not wear jewelry, make-up, hairpins, clips or nail  polish.  Do not wear lotions, powders, or perfumes.   Do not shave body from the neck down 48 hours prior to surgery just in case you cut yourself which could leave a site for infection.  Also, freshly shaved skin may become irritated if using the CHG soap.  Contact lenses, hearing aids and dentures may not be worn into surgery.  Do not bring valuables to the hospital. [facility] is not responsible for any missing/lost belongings or valuables.   Bring your C-PAP to the hospital with you in case you may have to spend the night.   Notify your doctor if there is any change in your medical condition (cold, fever, infection).  Wear comfortable clothing (specific to your surgery type) to the hospital.  After surgery, you can help prevent lung complications by doing breathing exercises.  Take deep breaths and cough every 1-2 hours. Your doctor may order a device called an Incentive Spirometer to help you take deep breaths.  If you are being discharged the day of surgery, you will not be allowed to drive home. You will need a responsible adult (18 years or older) to drive you home and stay with you that night.   If you are taking public transportation, you will need to have a responsible adult (18 years or older) with you. Please confirm with your physician that it is acceptable to use public transportation.   Please call the [facility] at [phone] if you have any questions about these instructions.  Surgery Visitation  Policy:  Patients undergoing a surgery or procedure may have two family members or support persons with them as long as the person is not COVID-19 positive or experiencing its symptoms.      Preparing for Surgery with CHLORHEXIDINE GLUCONATE (CHG) Soap  Chlorhexidine Gluconate (CHG) Soap  o An antiseptic cleaner that kills germs and bonds with the skin to continue killing germs even after washing  o Used for showering the night before surgery  and morning of surgery  Before surgery, you can play an important role by reducing the number of germs on your skin.  CHG (Chlorhexidine gluconate) soap is an antiseptic cleanser which kills germs and bonds with the skin to continue killing germs even after washing.  Please do not use if you have an allergy to CHG or antibacterial soaps. If your skin becomes reddened/irritated stop using the CHG.  1. Shower the NIGHT BEFORE SURGERY and the MORNING OF SURGERY with CHG soap.  2. If you choose to wash your hair, wash your hair first as usual with your normal shampoo.  3. After shampooing, rinse your hair and body thoroughly to remove the shampoo.  4. Use CHG as you would any other liquid soap. You can apply CHG directly to the skin and wash gently with a scrungie or a clean washcloth.  5. Apply the CHG soap to your body only from the neck down. Do not use on open wounds or open sores. Avoid contact with your eyes, ears, mouth, and genitals (private parts). Wash face and genitals (private parts) with your normal soap.  6. Wash thoroughly, paying special attention to the area where your surgery will be performed.  7. Thoroughly rinse your body with warm water.  8. Do not shower/wash with your normal soap after using and rinsing off the CHG soap.  9. Pat yourself dry with a clean towel.  10. Wear clean pajamas to bed the night before surgery.  12. Place clean sheets on your bed the night of your first shower and do not sleep with pets.  13. Shower again with the CHG soap on the day of surgery prior to arriving at the hospital.  14. Do not apply any deodorants/lotions/powders.  15. Please wear clean clothes to the hospital.

## 2022-07-28 ENCOUNTER — Inpatient Hospital Stay: Admission: RE | Admit: 2022-07-28 | Payer: Medicare Other | Source: Ambulatory Visit

## 2022-07-29 ENCOUNTER — Encounter: Payer: Self-pay | Admitting: Urgent Care

## 2022-07-29 ENCOUNTER — Ambulatory Visit: Payer: Medicare Other

## 2022-07-29 ENCOUNTER — Encounter
Admission: RE | Admit: 2022-07-29 | Discharge: 2022-07-29 | Disposition: A | Discharge status: Home / Self Care | Payer: Medicare Other | Source: Ambulatory Visit | Attending: General Surgery | Admitting: General Surgery

## 2022-07-29 DIAGNOSIS — G4733 Obstructive sleep apnea (adult) (pediatric): Secondary | ICD-10-CM

## 2022-07-29 DIAGNOSIS — G4719 Other hypersomnia: Secondary | ICD-10-CM

## 2022-07-29 DIAGNOSIS — Z01810 Encounter for preprocedural cardiovascular examination: Secondary | ICD-10-CM | POA: Diagnosis not present

## 2022-07-29 DIAGNOSIS — Z01812 Encounter for preprocedural laboratory examination: Secondary | ICD-10-CM

## 2022-07-29 DIAGNOSIS — I498 Other specified cardiac arrhythmias: Secondary | ICD-10-CM | POA: Diagnosis not present

## 2022-07-29 DIAGNOSIS — I10 Essential (primary) hypertension: Secondary | ICD-10-CM | POA: Insufficient documentation

## 2022-07-29 DIAGNOSIS — Z01818 Encounter for other preprocedural examination: Secondary | ICD-10-CM | POA: Insufficient documentation

## 2022-07-29 LAB — CBC
HCT: 38.2 % (ref 36.0–46.0)
Hemoglobin: 12.9 g/dL (ref 12.0–15.0)
MCH: 31.9 pg (ref 26.0–34.0)
MCHC: 33.8 g/dL (ref 30.0–36.0)
MCV: 94.3 fL (ref 80.0–100.0)
Platelets: 242 10*3/uL (ref 150–400)
RBC: 4.05 MIL/uL (ref 3.87–5.11)
RDW: 12.3 % (ref 11.5–15.5)
WBC: 5.2 10*3/uL (ref 4.0–10.5)
nRBC: 0 % (ref 0.0–0.2)

## 2022-07-29 LAB — BASIC METABOLIC PANEL
Anion gap: 7 (ref 5–15)
BUN: 11 mg/dL (ref 8–23)
CO2: 28 mmol/L (ref 22–32)
Calcium: 9.1 mg/dL (ref 8.9–10.3)
Chloride: 106 mmol/L (ref 98–111)
Creatinine, Ser: 0.69 mg/dL (ref 0.44–1.00)
GFR, Estimated: >60 mL/min (ref >60)
Glucose, Bld: 93 mg/dL (ref 70–99)
Potassium: 3.6 mmol/L (ref 3.5–5.1)
Sodium: 141 mmol/L (ref 135–145)

## 2022-08-03 ENCOUNTER — Other Ambulatory Visit: Payer: Self-pay

## 2022-08-03 ENCOUNTER — Encounter: Payer: Self-pay | Admitting: General Surgery

## 2022-08-03 ENCOUNTER — Encounter
Admission: RE | Disposition: A | Discharge status: Home / Self Care | Payer: Self-pay | Source: Home / Self Care | Attending: General Surgery

## 2022-08-03 ENCOUNTER — Ambulatory Visit
Admission: RE | Admit: 2022-08-03 | Discharge: 2022-08-03 | Disposition: A | Discharge status: Home / Self Care | Payer: Medicare Other | Attending: General Surgery | Admitting: General Surgery

## 2022-08-03 ENCOUNTER — Ambulatory Visit: Payer: Medicare Other | Admitting: Certified Registered"

## 2022-08-03 DIAGNOSIS — G4730 Sleep apnea, unspecified: Secondary | ICD-10-CM | POA: Insufficient documentation

## 2022-08-03 DIAGNOSIS — D171 Benign lipomatous neoplasm of skin and subcutaneous tissue of trunk: Secondary | ICD-10-CM | POA: Diagnosis not present

## 2022-08-03 DIAGNOSIS — M25511 Pain in right shoulder: Secondary | ICD-10-CM | POA: Insufficient documentation

## 2022-08-03 DIAGNOSIS — Z79899 Other long term (current) drug therapy: Secondary | ICD-10-CM | POA: Insufficient documentation

## 2022-08-03 DIAGNOSIS — I10 Essential (primary) hypertension: Secondary | ICD-10-CM | POA: Diagnosis not present

## 2022-08-03 HISTORY — PX: MASS EXCISION: SHX2000

## 2022-08-03 SURGERY — EXCISION MASS
Anesthesia: General | Site: Shoulder | Laterality: Right | Wound class: Clean

## 2022-08-03 MED ORDER — EPHEDRINE SULFATE (PRESSORS) 50 MG/ML IJ SOLN
INTRAMUSCULAR | Status: DC | PRN
  Administered 2022-08-03 (×2): 10 mg via INTRAVENOUS
  Administered 2022-08-03: 5 mg via INTRAVENOUS

## 2022-08-03 MED ORDER — TRAMADOL HCL 50 MG PO TABS
50.0000 mg | ORAL_TABLET | ORAL | Status: AC
  Administered 2022-08-03: 50 mg via ORAL

## 2022-08-03 MED ORDER — PROPOFOL 500 MG/50ML IV EMUL
INTRAVENOUS | Status: DC | PRN
  Administered 2022-08-03: 200 ug/kg/min via INTRAVENOUS

## 2022-08-03 MED ORDER — FENTANYL CITRATE (PF) 100 MCG/2ML IJ SOLN
INTRAMUSCULAR | Status: DC | PRN
  Administered 2022-08-03: 50 ug via INTRAVENOUS

## 2022-08-03 MED ORDER — BUPIVACAINE-EPINEPHRINE 0.5% -1:200000 IJ SOLN
INTRAMUSCULAR | Status: DC | PRN
  Administered 2022-08-03: 30 mL

## 2022-08-03 MED ORDER — ACETAMINOPHEN 500 MG PO TABS
ORAL_TABLET | ORAL | Status: AC
  Filled 2022-08-03: qty 2

## 2022-08-03 MED ORDER — LACTATED RINGERS IV SOLN: INTRAVENOUS | Status: DC

## 2022-08-03 MED ORDER — CEFAZOLIN SODIUM-DEXTROSE 2-4 GM/100ML-% IV SOLN
INTRAVENOUS | Status: AC
  Filled 2022-08-03: qty 100

## 2022-08-03 MED ORDER — ONDANSETRON HCL 4 MG/2ML IJ SOLN
INTRAMUSCULAR | Status: AC
  Filled 2022-08-03: qty 2

## 2022-08-03 MED ORDER — PROPOFOL 10 MG/ML IV BOLUS
INTRAVENOUS | Status: DC | PRN
  Administered 2022-08-03: 40 mg via INTRAVENOUS

## 2022-08-03 MED ORDER — PROPOFOL 1000 MG/100ML IV EMUL
INTRAVENOUS | Status: AC
  Filled 2022-08-03: qty 100

## 2022-08-03 MED ORDER — ONDANSETRON HCL 4 MG/2ML IJ SOLN
INTRAMUSCULAR | Status: DC | PRN
  Administered 2022-08-03: 4 mg via INTRAVENOUS

## 2022-08-03 MED ORDER — FAMOTIDINE 20 MG PO TABS
ORAL_TABLET | ORAL | Status: AC
  Administered 2022-08-03: 20 mg via ORAL
  Filled 2022-08-03: qty 1

## 2022-08-03 MED ORDER — FAMOTIDINE 20 MG PO TABS: 20.0000 mg | ORAL_TABLET | Freq: Once | ORAL | Status: AC

## 2022-08-03 MED ORDER — DEXAMETHASONE SODIUM PHOSPHATE 10 MG/ML IJ SOLN
INTRAMUSCULAR | Status: AC
  Filled 2022-08-03: qty 1

## 2022-08-03 MED ORDER — EPHEDRINE 5 MG/ML INJ
INTRAVENOUS | Status: AC
  Filled 2022-08-03: qty 5

## 2022-08-03 MED ORDER — BUPIVACAINE-EPINEPHRINE (PF) 0.5% -1:200000 IJ SOLN
INTRAMUSCULAR | Status: AC
  Filled 2022-08-03: qty 30

## 2022-08-03 MED ORDER — PROPOFOL 10 MG/ML IV BOLUS
INTRAVENOUS | Status: AC
  Filled 2022-08-03: qty 20

## 2022-08-03 MED ORDER — CHLORHEXIDINE GLUCONATE 0.12 % MT SOLN
OROMUCOSAL | Status: AC
  Administered 2022-08-03: 15 mL via OROMUCOSAL
  Filled 2022-08-03: qty 15

## 2022-08-03 MED ORDER — HYDROCODONE-ACETAMINOPHEN 5-325 MG PO TABS: 1.0000 | ORAL_TABLET | ORAL | 0 refills | Status: DC | PRN

## 2022-08-03 MED ORDER — PHENYLEPHRINE 80 MCG/ML (10ML) SYRINGE FOR IV PUSH (FOR BLOOD PRESSURE SUPPORT)
PREFILLED_SYRINGE | INTRAVENOUS | Status: AC
  Filled 2022-08-03: qty 10

## 2022-08-03 MED ORDER — ORAL CARE MOUTH RINSE: 15.0000 mL | Freq: Once | OROMUCOSAL | Status: AC

## 2022-08-03 MED ORDER — ACETAMINOPHEN 500 MG PO TABS
1000.0000 mg | ORAL_TABLET | ORAL | Status: AC
  Administered 2022-08-03: 1000 mg via ORAL

## 2022-08-03 MED ORDER — CEFAZOLIN SODIUM-DEXTROSE 2-4 GM/100ML-% IV SOLN
2.0000 g | INTRAVENOUS | Status: AC
  Administered 2022-08-03: 2 g via INTRAVENOUS

## 2022-08-03 MED ORDER — DEXMEDETOMIDINE HCL IN NACL 200 MCG/50ML IV SOLN
INTRAVENOUS | Status: DC | PRN
  Administered 2022-08-03: 8 ug via INTRAVENOUS

## 2022-08-03 MED ORDER — TRAMADOL HCL 50 MG PO TABS
ORAL_TABLET | ORAL | Status: AC
  Filled 2022-08-03: qty 1

## 2022-08-03 MED ORDER — FENTANYL CITRATE (PF) 100 MCG/2ML IJ SOLN
INTRAMUSCULAR | Status: AC
  Filled 2022-08-03: qty 2

## 2022-08-03 MED ORDER — TRAMADOL HCL 50 MG PO TABS: 50.0000 mg | ORAL_TABLET | Freq: Four times a day (QID) | ORAL | 0 refills | Status: AC | PRN

## 2022-08-03 MED ORDER — LIDOCAINE HCL (PF) 2 % IJ SOLN
INTRAMUSCULAR | Status: AC
  Filled 2022-08-03: qty 5

## 2022-08-03 MED ORDER — CHLORHEXIDINE GLUCONATE 0.12 % MT SOLN: 15.0000 mL | Freq: Once | OROMUCOSAL | Status: AC

## 2022-08-03 SURGICAL SUPPLY — 34 items
BLADE SURG 15 STRL LF DISP TIS (BLADE) ×1 IMPLANT
BLADE SURG 15 STRL SS (BLADE) ×1
CHLORAPREP W/TINT 26 (MISCELLANEOUS) ×1 IMPLANT
CNTNR SPEC 2.5X3XGRAD LEK (MISCELLANEOUS)
CONT SPEC 4OZ STRL OR WHT (MISCELLANEOUS)
CONTAINER SPEC 2.5X3XGRAD LEK (MISCELLANEOUS) ×1 IMPLANT
DERMABOND ADVANCED .7 DNX12 (GAUZE/BANDAGES/DRESSINGS) ×1 IMPLANT
DRAPE LAPAROTOMY 100X77 ABD (DRAPES) ×1 IMPLANT
DRAPE UNDER BUTTOCK W/FLU (DRAPES) IMPLANT
ELECT REM PT RETURN 9FT ADLT (ELECTROSURGICAL) ×1
ELECTRODE REM PT RTRN 9FT ADLT (ELECTROSURGICAL) ×1 IMPLANT
GAUZE 4X4 16PLY ~~LOC~~+RFID DBL (SPONGE) ×1 IMPLANT
GLOVE BIO SURGEON STRL SZ 6.5 (GLOVE) ×1 IMPLANT
GLOVE BIOGEL PI IND STRL 6.5 (GLOVE) ×1 IMPLANT
GOWN STRL REUS W/TWL MED LVL3 (GOWN DISPOSABLE) IMPLANT
KIT TURNOVER KIT A (KITS) ×1 IMPLANT
LABEL OR SOLS (LABEL) ×1 IMPLANT
MANIFOLD NEPTUNE II (INSTRUMENTS) ×1 IMPLANT
MARGIN MAP 10MM (MISCELLANEOUS) ×1 IMPLANT
NDL HYPO 25X1 1.5 SAFETY (NEEDLE) ×1 IMPLANT
NEEDLE HYPO 25X1 1.5 SAFETY (NEEDLE) ×1 IMPLANT
NS IRRIG 500ML POUR BTL (IV SOLUTION) ×1 IMPLANT
PACK BASIN MINOR ARMC (MISCELLANEOUS) ×1 IMPLANT
SUT ETHILON 3-0 (SUTURE) ×1 IMPLANT
SUT MNCRL 4-0 (SUTURE) ×1
SUT MNCRL 4-0 27XMFL (SUTURE) ×1
SUT VIC AB 2-0 SH 27 (SUTURE) ×1
SUT VIC AB 2-0 SH 27XBRD (SUTURE) ×1 IMPLANT
SUT VIC AB 3-0 SH 27 (SUTURE) ×1
SUT VIC AB 3-0 SH 27X BRD (SUTURE) IMPLANT
SUTURE MNCRL 4-0 27XMF (SUTURE) ×1 IMPLANT
SYR 10ML LL (SYRINGE) ×1 IMPLANT
TRAP FLUID SMOKE EVACUATOR (MISCELLANEOUS) ×1 IMPLANT
WATER STERILE IRR 500ML POUR (IV SOLUTION) ×1 IMPLANT

## 2022-08-03 NOTE — Transfer of Care (Signed)
Immediate Anesthesia Transfer of Care Note  Patient: Donna Colon  Procedure(s) Performed: EXCISION MASS (Right: Shoulder)  Patient Location: PACU  Anesthesia Type:General  Level of Consciousness: awake, alert , and oriented  Airway & Oxygen Therapy: Patient Spontanous Breathing and Patient connected to face mask oxygen  Post-op Assessment: Report given to RN and Post -op Vital signs reviewed and stable  Post vital signs: Reviewed and stable  Last Vitals:  Vitals Value Taken Time  BP 119/52 08/03/22 1015  Temp    Pulse    Resp 23 08/03/22 1019  SpO2    Vitals shown include unvalidated device data.  Last Pain:  Vitals:   08/03/22 0839  TempSrc: Temporal  PainSc: 0-No pain         Complications: No notable events documented.

## 2022-08-03 NOTE — Interval H&P Note (Signed)
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  The various methods of treatment have been discussed with the patient and family. After consideration of risks, benefits and other options for treatment, the patient has consented to  Procedure(s): EXCISION MASS (Right) as a surgical intervention.  The patient's history has been reviewed, patient examined, no change in status, stable for surgery.  I have reviewed the patient's chart and labs.  Questions were answered to the patient's satisfaction.     Herbert Pun

## 2022-08-03 NOTE — Anesthesia Preprocedure Evaluation (Signed)
Anesthesia Evaluation  Patient identified by MRN, date of birth, ID band Patient awake    Reviewed: Allergy & Precautions, H&P , NPO status , Patient's Chart, lab work & pertinent test results, reviewed documented beta blocker date and time   Airway Mallampati: II  TM Distance: >3 FB Neck ROM: full    Dental  (+) Teeth Intact   Pulmonary sleep apnea    Pulmonary exam normal        Cardiovascular Exercise Tolerance: Good hypertension, On Medications negative cardio ROS Normal cardiovascular exam Rate:Normal     Neuro/Psych negative neurological ROS  negative psych ROS   GI/Hepatic negative GI ROS, Neg liver ROS,,,  Endo/Other  negative endocrine ROS    Renal/GU negative Renal ROS  negative genitourinary   Musculoskeletal   Abdominal   Peds  Hematology negative hematology ROS (+)   Anesthesia Other Findings   Reproductive/Obstetrics negative OB ROS                             Anesthesia Physical Anesthesia Plan  ASA: 3  Anesthesia Plan: General LMA   Post-op Pain Management:    Induction:   PONV Risk Score and Plan: 4 or greater  Airway Management Planned:   Additional Equipment:   Intra-op Plan:   Post-operative Plan:   Informed Consent: I have reviewed the patients History and Physical, chart, labs and discussed the procedure including the risks, benefits and alternatives for the proposed anesthesia with the patient or authorized representative who has indicated his/her understanding and acceptance.       Plan Discussed with: CRNA  Anesthesia Plan Comments:        Anesthesia Quick Evaluation

## 2022-08-03 NOTE — Discharge Instructions (Signed)
  Diet: Resume home heart healthy regular diet.   Activity: Increase activity as tolerated. Light activity and walking are encouraged. Do not drive or drink alcohol if taking narcotic pain medications.  Wound care: May shower with soapy water and pat dry (do not rub incisions), but no baths or submerging incision underwater until follow-up. (no swimming)   Medications: Resume all home medications. For mild to moderate pain: acetaminophen (Tylenol) or ibuprofen (if no kidney disease). Combining Tylenol with alcohol can substantially increase your risk of causing liver disease. Narcotic pain medications, if prescribed, can be used for severe pain, though may cause nausea, constipation, and drowsiness. Do not combine Tylenol and Norco within a 6 hour period as Norco contains Tylenol. If you do not need the narcotic pain medication, you do not need to fill the prescription.  Call office ([phone]) at any time if any questions, worsening pain, fevers/chills, bleeding, drainage from incision site, or other concerns.  

## 2022-08-03 NOTE — Op Note (Signed)
OPERATION REPORT  Pre Operative Diagnosis: Right shoulder lipoma  Post operative diagnosis: Same  Anesthesia: MAC and Local   Surgeon: Dr. Windell Moment   Indication: This 66 y.o. year old female with a soft tissue mass that is causing pain and increasing in size.    Description of procedure: after orienting patient about the procedure steps and benefits and patient agreed to proceed. Time out was done identifying correct patient and location of procedure. After induction of monitored sedation, local anesthesia was infiltrated around the palpable lesion. With a blade #15, an elliptical incision was made using the skin lines. Sharp dissection was carried down and lesion was excised including dermal tissue. The mass measured 7 cm. Deep dermal stitches were done with vicryl 3-0 to repair the laceration and skin closed with Monocryl 4-0 in subcuticular fashion. Specimen sent to pathology.    Complications: none   EBL: minimal  Herbert Pun, MD, FACS

## 2022-08-04 LAB — SURGICAL PATHOLOGY

## 2022-08-05 NOTE — Anesthesia Postprocedure Evaluation (Signed)
Anesthesia Post Note  Patient: Donna Colon  Procedure(s) Performed: EXCISION MASS (Right: Shoulder)  Patient location during evaluation: PACU Anesthesia Type: General Level of consciousness: awake and alert Pain management: pain level controlled Vital Signs Assessment: post-procedure vital signs reviewed and stable Respiratory status: spontaneous breathing, nonlabored ventilation, respiratory function stable and patient connected to nasal cannula oxygen Cardiovascular status: blood pressure returned to baseline and stable Postop Assessment: no apparent nausea or vomiting Anesthetic complications: no   No notable events documented.   Last Vitals:  Vitals:   08/03/22 1033 08/03/22 1049  BP: (!) 124/52 (!) 146/68  Pulse: 84 64  Resp: 16 16  Temp:  (!) 36.1 C  SpO2: 100% 100%    Last Pain:  Vitals:   08/03/22 1111  TempSrc:   PainSc: Boswell Ame Heagle

## 2022-08-08 ENCOUNTER — Other Ambulatory Visit: Payer: Self-pay | Admitting: Surgery

## 2022-08-08 MED ORDER — SULFAMETHOXAZOLE-TRIMETHOPRIM 800-160 MG PO TABS: 1.0000 | ORAL_TABLET | Freq: Two times a day (BID) | ORAL | 0 refills | Status: AC

## 2022-08-20 ENCOUNTER — Telehealth: Payer: Self-pay | Admitting: Pulmonary Disease

## 2022-08-20 DIAGNOSIS — G4733 Obstructive sleep apnea (adult) (pediatric): Secondary | ICD-10-CM

## 2022-08-20 NOTE — Telephone Encounter (Signed)
Call patient  Sleep study result  Date of study: 07/29/2022  Impression: Mild obstructive sleep apnea Moderate oxygen desaturations  Recommendation: Options of treatment for mild obstructive sleep apnea will include  1.  CPAP therapy if there is significant daytime sleepiness or other comorbidities including history of CVA or cardiac disease  -If CPAP is chosen as an option of treatment auto titrating CPAP with a pressure setting of 5-15 will be appropriate  2.  Watchful waiting with emphasis on weight loss measures, sleep position modification to optimize lateral sleep, elevating the head of the bed by about 30 degrees may also help.  3.  An oral device may be fashioned for the treatment of mild sleep disordered breathing, will involve referral to dentist.  Follow-up as previously scheduled

## 2022-08-22 NOTE — Telephone Encounter (Signed)
I called and spoke with the pt and notified of response per Dr Ander Slade She verbalized understanding  She wants to try the pressure change of 5-15 I placed order and advised that she call if any issues  Nothing further needed

## 2022-09-01 ENCOUNTER — Telehealth: Payer: Self-pay | Admitting: Pulmonary Disease

## 2022-09-01 DIAGNOSIS — G4733 Obstructive sleep apnea (adult) (pediatric): Secondary | ICD-10-CM

## 2022-09-01 NOTE — Telephone Encounter (Signed)
PT calling. Recent change in her O2 has not really helped much, she said. Would like a referral for a mouth piece for a dentist somewhere near [place]. Please call to advise. [phone]

## 2022-09-02 NOTE — Telephone Encounter (Signed)
Called patient and left voicemail for her to call office back to got over cpap change and mouth piece.

## 2022-09-06 NOTE — Telephone Encounter (Signed)
PT ret call. Pls call.Asked her to stay close to phone.

## 2022-09-07 NOTE — Telephone Encounter (Signed)
Called and spoke with patient, she states that her dentist does not do oral appliances for OSA and would like a referral to someone in [place].  She lives in [place] and it is a long way to go to and from [place] for her.  I let her know that we can use someone in [place], however, we have better success with dentists in [place].  I advised her that I would send a message to Dr. Ander Slade and once we hear back from him we will call her back with his recommendations.  She verbalized understanding.  She asked that if we do not get her on the phone to leave a detailed message.  Dr. Ander Slade, please advise if ok to send referral to dentist in [facility] for oral device for OSA for this patient?  Thank you.

## 2022-09-16 NOTE — Telephone Encounter (Signed)
Okay to place a referral but I do not know anybody specifically in [place] that does oral appliances

## 2022-09-16 NOTE — Telephone Encounter (Signed)
Called and spoke with patient. I was able to find a facility on [address] in [place] named [facility]. I provided her with the information and she is ok with the referral being placed.   Referral has been placed.   Nothing further needed at time of call.

## 2023-06-03 ENCOUNTER — Telehealth: Payer: Self-pay | Admitting: Family Medicine

## 2023-06-03 ENCOUNTER — Ambulatory Visit
Admission: EM | Admit: 2023-06-03 | Discharge: 2023-06-03 | Disposition: A | Discharge status: Home / Self Care | Payer: Medicare HMO | Attending: Family Medicine | Admitting: Family Medicine

## 2023-06-03 DIAGNOSIS — R252 Cramp and spasm: Secondary | ICD-10-CM | POA: Insufficient documentation

## 2023-06-03 LAB — BASIC METABOLIC PANEL
Anion gap: 9 (ref 5–15)
BUN: 15 mg/dL (ref 8–23)
CO2: 27 mmol/L (ref 22–32)
Calcium: 8.7 mg/dL — ABNORMAL LOW (ref 8.9–10.3)
Chloride: 101 mmol/L (ref 98–111)
Creatinine, Ser: 0.75 mg/dL (ref 0.44–1.00)
GFR, Estimated: >60 mL/min (ref >60)
Glucose, Bld: 103 mg/dL — ABNORMAL HIGH (ref 70–99)
Potassium: 4 mmol/L (ref 3.5–5.1)
Sodium: 137 mmol/L (ref 135–145)

## 2023-06-03 LAB — MAGNESIUM: Magnesium: 2.1 mg/dL (ref 1.7–2.4)

## 2023-06-03 NOTE — ED Provider Notes (Signed)
[facility]    CSN: [id number] Arrival date & time: 06/03/23  0806      History   Chief Complaint Chief Complaint  Patient presents with   Spasms    HPI Donna Colon is a 67 y.o. female.   HPI Here for muscle cramps in her legs and feet that been going on for about 2 weeks.  She states they have been drawing up a lot.  She has been trying to take some over-the-counter potassium. No recent fever or vomiting and diarrhea She drinks plenty of water  Currently she is not taking anything for blood pressure.  She has had a slight swelling in her legs, but no redness.  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    Past Surgical History:  Procedure Laterality Date   CARDIAC CATHETERIZATION     CATARACT EXTRACTION W/ INTRAOCULAR LENS IMPLANT Right 2015   CATARACT EXTRACTION W/PHACO Left 03/01/2022   Procedure: CATARACT EXTRACTION PHACO AND INTRAOCULAR LENS PLACEMENT (IOC) LEFT 4.78 00:30.3;  Surgeon: Galen Manila, MD;  Location: [facility];  Service: Ophthalmology;  Laterality: Left;  sleep apnea   COLONOSCOPY     MASS EXCISION Right 08/03/2022   Procedure: EXCISION MASS;  Surgeon: Carolan Shiver, MD;  Location: [facility];  Service: General;  Laterality: Right;   OOPHORECTOMY Right 1995    OB History   No obstetric history on file.      Home Medications    Prior to Admission medications   Medication Sig Start Date End Date Taking? Authorizing Provider  aspirin EC 81 MG tablet Take 81 mg by mouth every other day.   Yes [provider]  cetirizine (ZYRTEC) 10 MG tablet Take 10 mg by mouth daily as needed. 01/28/22  Yes  [provider]  Cholecalciferol (VITAMIN D) 50 MCG (2000 UT) CAPS Take 1 capsule by mouth 3 (three) times a week.   Yes [provider]  ferrous sulfate 325 (65 FE) MG tablet Take 325 mg by mouth daily with breakfast.   Yes [provider]  Magnesium 500 MG TABS Take by mouth daily.   Yes [provider]  Multiple Vitamin (MULTIVITAMIN) capsule Take 1 capsule by mouth daily.   Yes [provider]  traMADol (ULTRAM) 50 MG tablet Take 1 tablet (50 mg total) by mouth every 6 (six) hours as needed. 08/03/22 08/03/23 Yes Carolan Shiver, MD    Family History Family History  Problem Relation Age of Onset   Heart failure Mother    Cancer Father     Social History Social History   Tobacco Use   Smoking status: Never   Smokeless tobacco: Never  Vaping Use   Vaping status: Never Used  Substance Use Topics   Alcohol use: No   Drug use: No     Allergies   Codeine   Review of Systems Review of Systems   Physical Exam Triage Vital Signs ED Triage Vitals  Encounter Vitals Group     BP 06/03/23 0820 (!) 148/77     Systolic BP Percentile --      Diastolic BP Percentile --  Pulse Rate 06/03/23 0820 79     Resp --      Temp 06/03/23 0820 98.9 F (37.2 C)     Temp Source 06/03/23 0820 Oral     SpO2 06/03/23 0820 96 %     Weight 06/03/23 0819 162 lb (73.5 kg)     Height 06/03/23 0819 5\' 3"  (1.6 m)     Head Circumference --      Peak Flow --      Pain Score 06/03/23 0818 9     Pain Loc --      Pain Education --      Exclude from Growth Chart --    No data found.  Updated Vital Signs BP (!) 148/77 (BP Location: Left Arm)   Pulse 79   Temp 98.9 F (37.2 C) (Oral)   Ht 5\' 3"  (1.6 m)   Wt 73.5 kg   SpO2 96%   BMI 28.70 kg/m   Visual Acuity Right Eye Distance:   Left Eye Distance:   Bilateral Distance:    Right Eye Near:   Left Eye Near:    Bilateral Near:     Physical Exam Vitals reviewed.   Constitutional:      General: She is not in acute distress.    Appearance: She is not ill-appearing, toxic-appearing or diaphoretic.  HENT:     Mouth/Throat:     Mouth: Mucous membranes are moist.  Eyes:     Extraocular Movements: Extraocular movements intact.     Conjunctiva/sclera: Conjunctivae normal.     Pupils: Pupils are equal, round, and reactive to light.  Cardiovascular:     Rate and Rhythm: Normal rate and regular rhythm.     Heart sounds: No murmur heard. Pulmonary:     Effort: Pulmonary effort is normal.     Breath sounds: Normal breath sounds.  Musculoskeletal:     Cervical back: Neck supple.     Right lower leg: No edema.     Left lower leg: No edema.  Lymphadenopathy:     Cervical: No cervical adenopathy.  Skin:    Coloration: Skin is not jaundiced or pale.     Findings: No rash.  Neurological:     General: No focal deficit present.     Mental Status: She is alert and oriented to person, place, and time.  Psychiatric:        Behavior: Behavior normal.      UC Treatments / Results  Labs (all labs ordered are listed, but only abnormal results are displayed) Labs Reviewed  BASIC METABOLIC PANEL  MAGNESIUM    EKG   Radiology No results found.  Procedures Procedures (including critical care time)  Medications Ordered in UC Medications - No data to display  Initial Impression / Assessment and Plan / UC Course  I have reviewed the triage vital signs and the nursing notes.  Pertinent labs & imaging results that were available during my care of the patient were reviewed by me and considered in my medical decision making (see chart for details).      BMP and magnesium are drawn today.  We will notify her if there is any electrolyte abnormality that needs treatment. Final Clinical Impressions(s) / UC Diagnoses   Final diagnoses:  Leg cramps     Discharge Instructions      We have drawn blood to check your sodium and potassium and calcium  and magnesium.  We will let you know if anything is significantly abnormal and  needs treatment.     ED Prescriptions   None    I have reviewed the PDMP during this encounter.   Zenia Resides, MD 06/03/23 [phone]

## 2023-06-03 NOTE — Discharge Instructions (Signed)
We have drawn blood to check your sodium and potassium and calcium and magnesium.  We will let you know if anything is significantly abnormal and needs treatment.

## 2023-06-03 NOTE — Telephone Encounter (Signed)
Spoke with patient and verified identity.  I relayed results and let her know her calcium was slightly low, though that may not be enough to be causing her leg cramps.  She is going to take over-the-counter calcium once daily and make sure she is drinking plenty of fluids.  She will follow-up with her primary care

## 2023-06-03 NOTE — ED Triage Notes (Signed)
Pt c/o muscle spasms and cramps in feet and lower legs x2weeks  Pt has been using otc potassium pills for cramping. Pt states that when she stretches she gets cramps as well.   Pt denies any changes to diet or physical activity.  Pt denies any bruising.

## 2023-06-04 ENCOUNTER — Ambulatory Visit: Payer: Self-pay
